# Patient Record
Sex: Female | Born: 1985 | Hispanic: No | Marital: Married | Smoking: Never smoker
Health system: Southern US, Community
[De-identification: ages and names within clinical notes are randomized; demographics above are authoritative.]

## PROBLEM LIST (undated history)

## (undated) ENCOUNTER — Inpatient Hospital Stay (HOSPITAL_COMMUNITY): Payer: Self-pay

## (undated) DIAGNOSIS — O4690 Antepartum hemorrhage, unspecified, unspecified trimester: Secondary | ICD-10-CM

## (undated) DIAGNOSIS — Z789 Other specified health status: Secondary | ICD-10-CM

---

## 2005-01-27 DIAGNOSIS — O321XX0 Maternal care for breech presentation, not applicable or unspecified: Secondary | ICD-10-CM

## 2014-01-15 ENCOUNTER — Other Ambulatory Visit (HOSPITAL_COMMUNITY)
Admission: RE | Admit: 2014-01-15 | Discharge: 2014-01-15 | Disposition: A | Discharge status: Home / Self Care | Payer: Private Health Insurance - Indemnity | Source: Ambulatory Visit | Attending: Family Medicine | Admitting: Family Medicine

## 2014-01-15 DIAGNOSIS — Z113 Encounter for screening for infections with a predominantly sexual mode of transmission: Secondary | ICD-10-CM | POA: Insufficient documentation

## 2014-01-15 DIAGNOSIS — Z124 Encounter for screening for malignant neoplasm of cervix: Secondary | ICD-10-CM | POA: Insufficient documentation

## 2014-06-21 ENCOUNTER — Inpatient Hospital Stay (HOSPITAL_COMMUNITY): Payer: Managed Care, Other (non HMO)

## 2014-06-21 ENCOUNTER — Encounter (HOSPITAL_COMMUNITY): Payer: Self-pay | Admitting: Obstetrics and Gynecology

## 2014-06-21 ENCOUNTER — Inpatient Hospital Stay (HOSPITAL_COMMUNITY)
Admission: AD | Admit: 2014-06-21 | Discharge: 2014-06-21 | Disposition: A | Discharge status: Home / Self Care | Payer: Managed Care, Other (non HMO) | Source: Ambulatory Visit | Attending: Obstetrics and Gynecology | Admitting: Obstetrics and Gynecology

## 2014-06-21 DIAGNOSIS — O4691 Antepartum hemorrhage, unspecified, first trimester: Secondary | ICD-10-CM

## 2014-06-21 DIAGNOSIS — Z3A01 Less than 8 weeks gestation of pregnancy: Secondary | ICD-10-CM | POA: Insufficient documentation

## 2014-06-21 DIAGNOSIS — O200 Threatened abortion: Secondary | ICD-10-CM | POA: Diagnosis not present

## 2014-06-21 DIAGNOSIS — O209 Hemorrhage in early pregnancy, unspecified: Secondary | ICD-10-CM

## 2014-06-21 HISTORY — DX: Other specified health status: Z78.9

## 2014-06-21 LAB — URINALYSIS, ROUTINE W REFLEX MICROSCOPIC
BILIRUBIN URINE: NEGATIVE
Glucose, UA: NEGATIVE mg/dL
KETONES UR: NEGATIVE mg/dL
LEUKOCYTES UA: NEGATIVE
NITRITE: NEGATIVE
Protein, ur: NEGATIVE mg/dL
SPECIFIC GRAVITY, URINE: 1.02 (ref 1.005–1.030)
UROBILINOGEN UA: 0.2 mg/dL (ref 0.0–1.0)
pH: 6 (ref 5.0–8.0)

## 2014-06-21 LAB — URINE MICROSCOPIC-ADD ON

## 2014-06-21 LAB — CBC
HCT: 33.4 % — ABNORMAL LOW (ref 36.0–46.0)
HEMOGLOBIN: 10.8 g/dL — AB (ref 12.0–15.0)
MCH: 25.2 pg — ABNORMAL LOW (ref 26.0–34.0)
MCHC: 32.3 g/dL (ref 30.0–36.0)
MCV: 78 fL (ref 78.0–100.0)
Platelets: 268 10*3/uL (ref 150–400)
RBC: 4.28 MIL/uL (ref 3.87–5.11)
RDW: 16.7 % — ABNORMAL HIGH (ref 11.5–15.5)
WBC: 8.6 10*3/uL (ref 4.0–10.5)

## 2014-06-21 LAB — POCT PREGNANCY, URINE: Preg Test, Ur: POSITIVE — AB

## 2014-06-21 LAB — WET PREP, GENITAL
CLUE CELLS WET PREP: NONE SEEN
TRICH WET PREP: NONE SEEN
Yeast Wet Prep HPF POC: NONE SEEN

## 2014-06-21 LAB — HCG, QUANTITATIVE, PREGNANCY: hCG, Beta Chain, Quant, S: 4819 m[IU]/mL — ABNORMAL HIGH (ref <5)

## 2014-06-21 LAB — ABO/RH: ABO/RH(D): B POS

## 2014-06-21 NOTE — MAU Provider Note (Signed)
History     CSN: [id number]  Arrival date and time: 06/21/14 1406   First Provider Initiated Contact with Patient 06/21/14 1518      Chief Complaint  Patient presents with  . Vaginal Bleeding   HPI Lynn Mckenzie 28 y.o. [id number] @[redacted]w[redacted]d  presents to MAU with vaginal bleeding.  She first had light red spotting 2 days ago.  Yesterday it was brown and heavier spotting.  Today it is more bleeding with both brown and red blood.  She has changed her pad 3 times but they have not been soaked.  She denies abdominal pain or cramping and no dysuria.  She had planned for new ob visit at [facility] on 06/26/14. OB History   Grav Para Term Preterm Abortions TAB SAB Ect Mult Living   4 3 3       3       Past Medical History  Diagnosis Date  . Medical history non-contributory     Past Surgical History  Procedure Laterality Date  . Cesarean section      History reviewed. No pertinent family history.  History  Substance Use Topics  . Smoking status: Never Smoker   . Smokeless tobacco: Never Used  . Alcohol Use: No    Allergies: No Known Allergies  Prescriptions prior to admission  Medication Sig Dispense Refill  . Prenatal Vit-Fe Fumarate-FA (PRENATAL MULTIVITAMIN) TABS tablet Take 1 tablet by mouth daily at 12 noon.        Review of Systems  Constitutional: Negative for fever, chills and diaphoresis.  HENT: Negative for congestion and sore throat.   Eyes: Negative for blurred vision and double vision.  Respiratory: Negative for cough and shortness of breath.   Cardiovascular: Negative for chest pain and palpitations.  Gastrointestinal: Negative for heartburn, nausea, vomiting, abdominal pain, diarrhea and constipation.  Genitourinary: Negative for dysuria and frequency.  Musculoskeletal: Negative for back pain and neck pain.  Skin: Negative for itching and rash.  Neurological: Negative for dizziness, tingling, weakness and headaches.  Psychiatric/Behavioral: Negative for  depression, suicidal ideas and substance abuse. The patient is not nervous/anxious.    Physical Exam   Blood pressure 122/68, pulse 75, temperature 98 F (36.7 C), temperature source Oral, resp. rate 16, last menstrual period 05/01/2014.  Physical Exam  Constitutional: She is oriented to person, place, and time. She appears well-developed and well-nourished.  HENT:  Head: Normocephalic and atraumatic.  Eyes: EOM are normal.  Neck: Normal range of motion.  Cardiovascular: Normal rate and regular rhythm.   Respiratory: Effort normal and breath sounds normal. No respiratory distress.  GI: Soft. Bowel sounds are normal. She exhibits no distension and no mass. There is no tenderness. There is no rebound and no guarding.  Genitourinary:  Moderate amt of dark red blood pooled in vagina.   Cervix is closed.  No CMT/adnexal mass or tenderness  Musculoskeletal: Normal range of motion.  Neurological: She is alert and oriented to person, place, and time.  Skin: Skin is warm and dry.  Psychiatric: She has a normal mood and affect.   Results for orders placed during the hospital encounter of 06/21/14 (from the past 48 hour(s))  URINALYSIS, ROUTINE W REFLEX MICROSCOPIC     Status: Abnormal   Collection Time    06/21/14  2:25 PM      Result Value Ref Range   Color, Urine YELLOW  YELLOW   APPearance CLEAR  CLEAR   Specific Gravity, Urine 1.020  1.005 - 1.030   pH  6.0  5.0 - 8.0   Glucose, UA NEGATIVE  NEGATIVE mg/dL   Hgb urine dipstick TRACE (*) NEGATIVE   Bilirubin Urine NEGATIVE  NEGATIVE   Ketones, ur NEGATIVE  NEGATIVE mg/dL   Protein, ur NEGATIVE  NEGATIVE mg/dL   Urobilinogen, UA 0.2  0.0 - 1.0 mg/dL   Nitrite NEGATIVE  NEGATIVE   Leukocytes, UA NEGATIVE  NEGATIVE  URINE MICROSCOPIC-ADD ON     Status: Abnormal   Collection Time    06/21/14  2:25 PM      Result Value Ref Range   Squamous Epithelial / LPF FEW (*) RARE   WBC, UA 0-2  <3 WBC/hpf   RBC / HPF 0-2  <3 RBC/hpf  POCT  PREGNANCY, URINE     Status: Abnormal   Collection Time    06/21/14  2:34 PM      Result Value Ref Range   Preg Test, Ur POSITIVE (*) NEGATIVE   Comment:            THE SENSITIVITY OF THIS     METHODOLOGY IS >24 mIU/mL  CBC     Status: Abnormal   Collection Time    06/21/14  3:37 PM      Result Value Ref Range   WBC 8.6  4.0 - 10.5 K/uL   RBC 4.28  3.87 - 5.11 MIL/uL   Hemoglobin 10.8 (*) 12.0 - 15.0 g/dL   HCT 16.1 (*) 09.6 - 04.5 %   MCV 78.0  78.0 - 100.0 fL   MCH 25.2 (*) 26.0 - 34.0 pg   MCHC 32.3  30.0 - 36.0 g/dL   RDW 40.9 (*) 81.1 - 91.4 %   Platelets 268  150 - 400 K/uL  ABO/RH     Status: None   Collection Time    06/21/14  3:37 PM      Result Value Ref Range   ABO/RH(D) B POS    HCG, QUANTITATIVE, PREGNANCY     Status: Abnormal   Collection Time    06/21/14  3:37 PM      Result Value Ref Range   hCG, Beta Chain, Quant, S 4819 (*) <5 mIU/mL   Comment:              GEST. AGE      CONC.  (mIU/mL)       <=1 WEEK        5 - 50         2 WEEKS       50 - 500         3 WEEKS       100 - 10,000         4 WEEKS     1,000 - 30,000         5 WEEKS     3,500 - 115,000       6-8 WEEKS     12,000 - 270,000        12 WEEKS     15,000 - 220,000                FEMALE AND NON-PREGNANT FEMALE:         LESS THAN 5 mIU/mL  WET PREP, GENITAL     Status: Abnormal   Collection Time    06/21/14  4:00 PM      Result Value Ref Range   Yeast Wet Prep HPF POC NONE SEEN  NONE SEEN   Trich, Wet Prep NONE SEEN  NONE SEEN   Clue Cells Wet Prep HPF POC NONE SEEN  NONE SEEN   WBC, Wet Prep HPF POC FEW (*) NONE SEEN   Comment: FEW BACTERIA SEEN   [place] Ob Comp Less 14 Wks  06/21/2014   CLINICAL DATA:  Quantitative beta HCG 4,819, vaginal bleeding, first trimester pregnancy  EXAM: OBSTETRIC <14 WK ULTRASOUND  TECHNIQUE: Both transabdominal and transvaginal ultrasound examinations were performed for complete evaluation of the gestation as well as the maternal uterus, adnexal regions, and  pelvic cul-de-sac. Transvaginal technique was performed to assess early pregnancy.  Color and duplex Doppler ultrasound was utilized to evaluate blood flow to the ovaries.  COMPARISON:  None.  FINDINGS: Intrauterine gestational sac: None visualized  Yolk sac:  None visualized  Embryo:  None visualized  Cardiac Activity: Not visualized  Maternal uterus/adnexae: Right ovary is normal, containing a 2 cm cyst. Left ovary is not identified. The endometrium is thickened to 21 mm.There is trace free fluid.  IMPRESSION: No evidence of intrauterine gestation. Findings could represent early intrauterine gestation too early to visualize versus failed gestation. There are no specific features to suggest ectopic pregnancy, but the left ovary is not identified. Probable early intrauterine gestational sac, but no yolk sac, fetal pole, or cardiac activity yet visualized. Recommend follow-up quantitative B-HCG levels and follow-up US in 14 days to confirm and assess viability. This recommendation follows SRU consensus guidelines: Diagnostic Criteria for Nonviable Pregnancy Early in the First Trimester.   Electronically Signed   By: Esperanza Heiraymond  Rubner M.D.   On: 06/21/2014 17:14   [place] Ob Transvaginal  06/21/2014   CLINICAL DATA:  Quantitative beta HCG 4,819, vaginal bleeding, first trimester pregnancy  EXAM: OBSTETRIC <14 WK ULTRASOUND  TECHNIQUE: Both transabdominal and transvaginal ultrasound examinations were performed for complete evaluation of the gestation as well as the maternal uterus, adnexal regions, and pelvic cul-de-sac. Transvaginal technique was performed to assess early pregnancy.  Color and duplex Doppler ultrasound was utilized to evaluate blood flow to the ovaries.  COMPARISON:  None.  FINDINGS: Intrauterine gestational sac: None visualized  Yolk sac:  None visualized  Embryo:  None visualized  Cardiac Activity: Not visualized  Maternal uterus/adnexae: Right ovary is normal, containing a 2 cm cyst. Left ovary is not  identified. The endometrium is thickened to 21 mm.There is trace free fluid.  IMPRESSION: No evidence of intrauterine gestation. Findings could represent early intrauterine gestation too early to visualize versus failed gestation. There are no specific features to suggest ectopic pregnancy, but the left ovary is not identified. Probable early intrauterine gestational sac, but no yolk sac, fetal pole, or cardiac activity yet visualized. Recommend follow-up quantitative B-HCG levels and follow-up US in 14 days to confirm and assess viability. This recommendation follows SRU consensus guidelines: Diagnostic Criteria for Nonviable Pregnancy Early in the First Trimester.   Electronically Signed   By: Esperanza Heiraymond  Rubner M.D.   On: 06/21/2014 17:14     MAU Course  Procedures  MDM Quant of 4819, presence of moderate bleeding, positive blood type, u/s consistent with failed gestation vs early gestation.    Assessment and Plan  A: threatened abortion, vaginal bleeding in first trimester  P: Discharge to home Tylenol for pain management. Ectopic precautions Return to MAU for heavy bleeding, severe pain, etc. [facility]  If unable to be seen there, follow up in MAU in 48 hours for Quant HCG.   Return to MAU for emergency.  Glyn Adeeague Clark, Clydie BraunKaren  E 06/21/2014, 3:23 PM

## 2014-06-21 NOTE — MAU Note (Signed)
Pt presents stating that she is [redacted] weeks pregnant and started having bright red bleeding yesterday and it has gotten heavier today

## 2014-06-21 NOTE — Discharge Instructions (Signed)

## 2014-06-21 NOTE — MAU Provider Note (Signed)
Attestation of Attending Supervision of Advanced Practitioner (CNM/NP): Evaluation and management procedures were performed by the Advanced Practitioner under my supervision and collaboration.  I have reviewed the Advanced Practitioner's note and chart, and I agree with the management and plan.  Jeanmarc Viernes 06/21/2014 6:56 PM

## 2014-07-20 ENCOUNTER — Encounter (HOSPITAL_COMMUNITY): Payer: Self-pay | Admitting: Obstetrics and Gynecology

## 2014-09-05 ENCOUNTER — Encounter (HOSPITAL_COMMUNITY): Payer: Self-pay

## 2014-09-05 ENCOUNTER — Inpatient Hospital Stay (HOSPITAL_COMMUNITY)
Admission: AD | Admit: 2014-09-05 | Discharge: 2014-09-05 | Disposition: A | Discharge status: Home / Self Care | Payer: Managed Care, Other (non HMO) | Source: Ambulatory Visit | Attending: Obstetrics & Gynecology | Admitting: Obstetrics & Gynecology

## 2014-09-05 DIAGNOSIS — O209 Hemorrhage in early pregnancy, unspecified: Secondary | ICD-10-CM | POA: Diagnosis present

## 2014-09-05 DIAGNOSIS — O200 Threatened abortion: Secondary | ICD-10-CM | POA: Insufficient documentation

## 2014-09-05 DIAGNOSIS — O4690 Antepartum hemorrhage, unspecified, unspecified trimester: Secondary | ICD-10-CM

## 2014-09-05 DIAGNOSIS — Z3A01 Less than 8 weeks gestation of pregnancy: Secondary | ICD-10-CM | POA: Insufficient documentation

## 2014-09-05 HISTORY — DX: Antepartum hemorrhage, unspecified, unspecified trimester: O46.90

## 2014-09-05 LAB — URINALYSIS, ROUTINE W REFLEX MICROSCOPIC
Bilirubin Urine: NEGATIVE
Glucose, UA: NEGATIVE mg/dL
Ketones, ur: NEGATIVE mg/dL
LEUKOCYTES UA: NEGATIVE
NITRITE: NEGATIVE
PROTEIN: NEGATIVE mg/dL
SPECIFIC GRAVITY, URINE: 1.02 (ref 1.005–1.030)
Urobilinogen, UA: 0.2 mg/dL (ref 0.0–1.0)
pH: 7 (ref 5.0–8.0)

## 2014-09-05 LAB — URINE MICROSCOPIC-ADD ON

## 2014-09-05 LAB — CBC
HEMATOCRIT: 34.4 % — AB (ref 36.0–46.0)
Hemoglobin: 11.3 g/dL — ABNORMAL LOW (ref 12.0–15.0)
MCH: 27.7 pg (ref 26.0–34.0)
MCHC: 32.8 g/dL (ref 30.0–36.0)
MCV: 84.3 fL (ref 78.0–100.0)
PLATELETS: 271 10*3/uL (ref 150–400)
RBC: 4.08 MIL/uL (ref 3.87–5.11)
RDW: 16.3 % — AB (ref 11.5–15.5)
WBC: 8.7 10*3/uL (ref 4.0–10.5)

## 2014-09-05 LAB — HCG, QUANTITATIVE, PREGNANCY: HCG, BETA CHAIN, QUANT, S: 11 m[IU]/mL — AB (ref <5)

## 2014-09-05 LAB — ABO/RH: ABO/RH(D): B POS

## 2014-09-05 MED ORDER — PRENATAL VITAMINS PLUS 27-1 MG PO TABS: 1.0000 | ORAL_TABLET | Freq: Every day | ORAL | Status: DC

## 2014-09-05 NOTE — MAU Provider Note (Signed)
History     CSN: [id number]  Arrival date and time: 09/05/14 1806  Provider notified: 1843 & 1952 Provider on unit: 2040 Provider at bedside: 2045   Chief Complaint  Patient presents with  . Vaginal Bleeding   HPI  Ms. Lynn Mckenzie is a 28 yo G5P3013 at 5.[redacted] wks gestation presenting with heavy vaginal bleeding that started tonight.  The only activity she did today was cook for out of town friends.  She is having cramping in her lower abdomen. She was seen in the office 08/31/2014 for a quant (41.0).  Her primary OB provider at [facility] is Dr. Ernestina PennaFogleman.  Past Medical History  Diagnosis Date  . Medical history non-contributory   . Vaginal bleeding during pregnancy, antepartum 09/05/2014    Past Surgical History  Procedure Laterality Date  . Cesarean section      No family history on file.  History  Substance Use Topics  . Smoking status: Never Smoker   . Smokeless tobacco: Never Used  . Alcohol Use: No    Allergies: No Known Allergies  Prescriptions prior to admission  Medication Sig Dispense Refill Last Dose  . Prenatal Vit-Fe Fumarate-FA (PRENATAL MULTIVITAMIN) TABS tablet Take 1 tablet by mouth daily.    09/04/2014 at Unknown time    Review of Systems  Constitutional: Negative.   HENT: Negative.   Eyes: Negative.   Respiratory: Negative.   Cardiovascular: Negative.   Gastrointestinal: Negative.   Genitourinary:       Heavy, red vaginal bleeding; lower abdominal pain  Musculoskeletal: Negative.   Skin: Negative.   Neurological: Negative.   Endo/Heme/Allergies: Negative.   Psychiatric/Behavioral: Negative.    Results for orders placed or performed during the hospital encounter of 09/05/14 (from the past 24 hour(s))  Urinalysis, Routine w reflex microscopic     Status: Abnormal   Collection Time: 09/05/14  6:20 PM  Result Value Ref Range   Color, Urine YELLOW YELLOW   APPearance CLEAR CLEAR   Specific Gravity, Urine 1.020 1.005 - 1.030   pH 7.0 5.0 -  8.0   Glucose, UA NEGATIVE NEGATIVE mg/dL   Hgb urine dipstick SMALL (A) NEGATIVE   Bilirubin Urine NEGATIVE NEGATIVE   Ketones, ur NEGATIVE NEGATIVE mg/dL   Protein, ur NEGATIVE NEGATIVE mg/dL   Urobilinogen, UA 0.2 0.0 - 1.0 mg/dL   Nitrite NEGATIVE NEGATIVE   Leukocytes, UA NEGATIVE NEGATIVE  Urine microscopic-add on     Status: Abnormal   Collection Time: 09/05/14  6:20 PM  Result Value Ref Range   Squamous Epithelial / LPF FEW (A) RARE   WBC, UA 0-2 <3 WBC/hpf   RBC / HPF 7-10 <3 RBC/hpf  hCG, quantitative, pregnancy     Status: Abnormal   Collection Time: 09/05/14  7:10 PM  Result Value Ref Range   hCG, Beta Chain, Quant, S 11 (H) <5 mIU/mL  CBC     Status: Abnormal   Collection Time: 09/05/14  7:10 PM  Result Value Ref Range   WBC 8.7 4.0 - 10.5 K/uL   RBC 4.08 3.87 - 5.11 MIL/uL   Hemoglobin 11.3 (L) 12.0 - 15.0 g/dL   HCT 72.534.4 (L) 36.636.0 - 44.046.0 %   MCV 84.3 78.0 - 100.0 fL   MCH 27.7 26.0 - 34.0 pg   MCHC 32.8 30.0 - 36.0 g/dL   RDW 34.716.3 (H) 42.511.5 - 95.615.5 %   Platelets 271 150 - 400 K/uL  ABO/Rh     Status: None   Collection Time: 09/05/14  7:10 PM  Result Value Ref Range   ABO/RH(D) B POS    Physical Exam   Blood pressure 106/64, pulse 73, temperature 98.5 F (36.9 C), resp. rate 18, height 5' 4.5" (1.638 m), weight 86.41 kg (190 lb 8 oz), last menstrual period 07/27/2014  Physical Exam  Constitutional: She is oriented to person, place, and time. She appears well-developed and well-nourished.  HENT:  Head: Normocephalic and atraumatic.  Eyes: Pupils are equal, round, and reactive to light.  Neck: Normal range of motion.  Cardiovascular: Normal rate, regular rhythm and normal heart sounds.   Respiratory: Effort normal and breath sounds normal.  GI: Soft. Bowel sounds are normal.  Genitourinary:  Uterus slightly enlarged; small amount of mucoid dark, red blood removed with large swabs; small trickle of red blood coming from cervical os; VE:  closed/long/average/posterior  Musculoskeletal: Normal range of motion.  Neurological: She is alert and oriented to person, place, and time. She has normal reflexes.  Skin: Skin is warm and dry.  Psychiatric: She has a normal mood and affect. Her behavior is normal. Judgment and thought content normal.    MAU Course  Procedures [facility]UA UPT CBC ABO/Rh Beta HCG Assessment and Plan  28 yo [id number] @ 5.[redacted] wks gestation Threatened Miscarriage - falling quant Vaginal Bleeding in pregnancy, first trimester  Discharge Home Bleeding Precautions Keep scheduled appointment on Monday 09/07/2014 Call the office for increased bleeding and/or pain   Raelyn MoraAWSON, Barclay Lennox, Judie PetitM MSN, CNM 09/05/2014, 9:02 PM

## 2014-09-05 NOTE — MAU Note (Signed)
Pt states had positive upt at home and went to MD office and also had +upt. Has appt Monday for BHCG. Began bleeding today and is concerned because last pregnancy ended in miscarriage.

## 2014-09-05 NOTE — Discharge Instructions (Signed)
Human Chorionic Gonadotropin (hCG) This is a test to confirm and monitor pregnancy or to diagnose trophoblastic disease or germ cell tumors. As early as 10 days after a missed menstrual period (some methods can detect hCG even earlier, at one week after conception) or if your caregiver thinks that your symptoms suggest ectopic pregnancy, a failing pregnancy, trophoblastic disease, or germ cell tumors. hCG is a protein produced in the placenta of a pregnant woman. A pregnancy test is a specific blood or urine test that can detect hCG and confirm pregnancy. This hormone is able to be detected 10 days after a missed menstrual period, the time period when the fertilized egg is implanted in the woman's uterus. With some methods, hCG can be detected even earlier, at one week after conception.  During the early weeks of pregnancy, hCG is important in maintaining function of the corpus luteum (the mass of cells that forms from a mature egg). Production of hCG increases steadily during the first trimester (8-10 weeks), peaking around the 10th week after the last menstrual cycle. Levels then fall slowly during the remainder of the pregnancy. hCG is no longer detectable within a few weeks after delivery. hCG is also produced by some germ cell tumors and increased levels are seen in trophoblastic disease. SAMPLE COLLECTION Vaginal Bleeding During Pregnancy, First Trimester A small amount of bleeding (spotting) from the vagina is common in early pregnancy. Sometimes the bleeding is normal and is not a problem, and sometimes it is a sign of something serious. Be sure to tell your doctor about any bleeding from your vagina right away. HOME CARE  Watch your condition for any changes.  Follow your doctor's instructions about how active you can be.  If you are on bed rest:  You may need to stay in bed and only get up to use the bathroom.  You may be allowed to do some activities.  If you need help, make plans for  someone to help you.  Write down:  The number of pads you use each day.  How often you change pads.  How soaked (saturated) your pads are.  Do not use tampons.  Do not douche.  Do not have sex or orgasms until your doctor says it is okay.  If you pass any tissue from your vagina, save the tissue so you can show it to your doctor.  Only take medicines as told by your doctor.  Do not take aspirin because it can make you bleed.  Keep all follow-up visits as told by your doctor. GET HELP IF:   You bleed from your vagina.  You have cramps.  You have labor pains.  You have a fever that does not go away after you take medicine. GET HELP RIGHT AWAY IF:   You have very bad cramps in your back or belly (abdomen).  You pass large clots or tissue from your vagina.  You bleed more.  You feel light-headed or weak.  You pass out (faint).  You have chills.  You are leaking fluid or have a gush of fluid from your vagina.  You pass out while pooping (having a bowel movement). MAKE SURE YOU:  Understand these instructions.  Will watch your condition.  Will get help right away if you are not doing well or get worse. Document Released: 01/19/2014 Document Reviewed: 05/12/2013 [facility] Patient Information 2015 [place], [place]. This information is not intended to replace advice given to you by your health care provider. Make sure you  discuss any questions you have with your health care provider.  hCG is commonly detected in urine. The preferred specimen is a random urine sample collected first thing in the morning. hCG can also be measured in blood drawn from a vein in the arm. NORMAL FINDINGS Qualitative:negative in non-pregnant women; positive in pregnancy Quantitative:   Gestation less than 1 week: 5-50 Whole HCG (milli-international units/mL)  Gestation of 2 weeks: 50-500 Whole HCG (milli-international units/mL)  Gestation of 3 weeks: 100-10,000 Whole HCG  (milli-international units/mL)  Gestation of 4 weeks: 1,000-30,000 Whole HCG (milli-international units/mL)  Gestation of 5 weeks 3,500-115,000 Whole HCG (milli-international units/mL)  Gestation of 6-8 weeks: 12,000-270,000 Whole HCG (milli-international units/mL)  Gestation of 12 weeks: 15,000-220,000 Whole HCG (milli-international units/mL)  Males and non-pregnant females: less than 5 Whole HCG (milli-international units/mL) Beta subunit: depends on the method and test used Ranges for normal findings may vary among different laboratories and hospitals. You should always check with your doctor after having lab work or other tests done to discuss the meaning of your test results and whether your values are considered within normal limits. MEANING OF TEST  Your caregiver will go over the test results with you and discuss the importance and meaning of your results, as well as treatment options and the need for additional tests if necessary. OBTAINING THE TEST RESULTS It is your responsibility to obtain your test results. Ask the lab or department performing the test when and how you will get your results. Document Released: 10/06/2004 Document Revised: 11/27/2011 Document Reviewed: 12/08/2013 [facility] Patient Information 2015 [place], [place]. This information is not intended to replace advice given to you by your health care provider. Make sure you discuss any questions you have with your health care provider.   Threatened Miscarriage A threatened miscarriage is when you have vaginal bleeding during your first 20 weeks of pregnancy but the pregnancy has not ended. Your doctor will do tests to make sure you are still pregnant. The cause of the bleeding may not be known. This condition does not mean your pregnancy will end. It does increase the risk of it ending (complete miscarriage). HOME CARE   Make sure you keep all your doctor visits for prenatal care.  Get plenty of rest.  Do not have  sex or use tampons if you have vaginal bleeding.  Do not douche.  Do not smoke or use drugs.  Do not drink alcohol.  Avoid caffeine. GET HELP IF:  You have light bleeding from your vagina.  You have belly pain or cramping.  You have a fever. GET HELP RIGHT AWAY IF:   You have heavy bleeding from your vagina.  You have clots of blood coming from your vagina.  You have bad pain or cramps in your low back or belly.  You have fever, chills, and bad belly pain. MAKE SURE YOU:   Understand these instructions.  Will watch your condition.  Will get help right away if you are not doing well or get worse. Document Released: 08/17/2008 Document Revised: 09/09/2013 Document Reviewed: 07/01/2013 [facility] Patient Information 2015 [place], [place]. This information is not intended to replace advice given to you by your health care provider. Make sure you discuss any questions you have with your health care provider.

## 2014-09-07 LAB — POCT PREGNANCY, URINE: Preg Test, Ur: NEGATIVE

## 2015-05-25 LAB — OB RESULTS CONSOLE RUBELLA ANTIBODY, IGM: Rubella: IMMUNE

## 2015-05-25 LAB — OB RESULTS CONSOLE HIV ANTIBODY (ROUTINE TESTING): HIV: NONREACTIVE

## 2015-05-25 LAB — OB RESULTS CONSOLE HEPATITIS B SURFACE ANTIGEN: Hepatitis B Surface Ag: NEGATIVE

## 2015-05-25 LAB — OB RESULTS CONSOLE ABO/RH: RH Type: POSITIVE

## 2015-05-25 LAB — OB RESULTS CONSOLE HGB/HCT, BLOOD
HEMATOCRIT: 34 %
HEMOGLOBIN: 11.8 g/dL

## 2015-05-25 LAB — OB RESULTS CONSOLE RPR: RPR: NONREACTIVE

## 2015-05-25 LAB — OB RESULTS CONSOLE ANTIBODY SCREEN: Antibody Screen: NEGATIVE

## 2015-05-25 LAB — OB RESULTS CONSOLE VARICELLA ZOSTER ANTIBODY, IGG: Varicella: IMMUNE

## 2015-05-25 LAB — OB RESULTS CONSOLE PLATELET COUNT: PLATELETS: 267 10*3/uL

## 2015-06-08 LAB — OB RESULTS CONSOLE GC/CHLAMYDIA
CHLAMYDIA, DNA PROBE: NEGATIVE
GC PROBE AMP, GENITAL: NEGATIVE

## 2015-07-11 ENCOUNTER — Encounter (HOSPITAL_COMMUNITY): Payer: Self-pay | Admitting: *Deleted

## 2015-09-16 LAB — OB RESULTS CONSOLE PLATELET COUNT: PLATELETS: 206 10*3/uL

## 2015-09-16 LAB — OB RESULTS CONSOLE HGB/HCT, BLOOD
HCT: 28 %
Hemoglobin: 9.9 g/dL

## 2015-09-19 NOTE — L&D Delivery Note (Signed)
Patient is 30 y.o. [id number] 9145w4d admitted for onset of labor, hx of c-section with second child.  Successful VBAC with previous baby   Delivery Note At 1:17 AM a viable female was delivered via VBAC, Spontaneous (Presentation: Left Occiput Anterior).  APGAR: 9, 9; weight pending.  Infant dried and placed on mother's abdomen.  Cord clamped and cut by mother of baby.  Hospital cord blood sample collected.  Placenta delivered, fundal massage applied, and vagina inspected for lacerations.  Superficial laceration appreciated at the 5 o'clock position of the vagina.  This did not require repair and was hemostatic after delivery.  Placenta status: Intact, Spontaneous.  Cord: 3 vessels with the following complications: nuchal x1.   Anesthesia: Epidural  Episiotomy: None Lacerations: None Suture Repair: n/a Est. Blood Loss (mL): 100  Mom to postpartum.  Baby to Couplet care / Skin to Skin.  Philipp DeputyKim Jt Brabec, CNM, was gloved and supervised the entirety of this delivery.  Delynn FlavinAshly Gottschalk, DO 12/25/2015, 1:36 AM  I was gloved and present for delivery in its entirety.  Second stage of labor progressed to SVD/VBAC; few decels during second stage noted.  Complications: none  Lacerations: none  EBL: 100cc  Senna Lape, CNM 10:45 PM

## 2015-10-01 LAB — OB RESULTS CONSOLE RPR: RPR: NONREACTIVE

## 2015-11-16 ENCOUNTER — Encounter: Payer: Self-pay | Admitting: *Deleted

## 2015-11-16 DIAGNOSIS — Z3490 Encounter for supervision of normal pregnancy, unspecified, unspecified trimester: Secondary | ICD-10-CM

## 2015-11-16 DIAGNOSIS — Z98891 History of uterine scar from previous surgery: Secondary | ICD-10-CM | POA: Insufficient documentation

## 2015-11-23 ENCOUNTER — Ambulatory Visit (INDEPENDENT_AMBULATORY_CARE_PROVIDER_SITE_OTHER): Payer: Self-pay | Admitting: Family

## 2015-11-23 VITALS — BP 99/64 | HR 89 | Temp 98.4°F | Wt 211.1 lb

## 2015-11-23 DIAGNOSIS — Z113 Encounter for screening for infections with a predominantly sexual mode of transmission: Secondary | ICD-10-CM

## 2015-11-23 DIAGNOSIS — O34219 Maternal care for unspecified type scar from previous cesarean delivery: Secondary | ICD-10-CM

## 2015-11-23 DIAGNOSIS — Z3493 Encounter for supervision of normal pregnancy, unspecified, third trimester: Secondary | ICD-10-CM

## 2015-11-23 LAB — OB RESULTS CONSOLE GBS: STREP GROUP B AG: NEGATIVE

## 2015-11-23 LAB — POCT URINALYSIS DIP (DEVICE)
Bilirubin Urine: NEGATIVE
Glucose, UA: NEGATIVE mg/dL
HGB URINE DIPSTICK: NEGATIVE
KETONES UR: NEGATIVE mg/dL
Nitrite: NEGATIVE
PROTEIN: NEGATIVE mg/dL
SPECIFIC GRAVITY, URINE: 1.02 (ref 1.005–1.030)
UROBILINOGEN UA: 0.2 mg/dL (ref 0.0–1.0)
pH: 7.5 (ref 5.0–8.0)

## 2015-11-23 NOTE — Progress Notes (Signed)
   Subjective:    Tami LinSamirah Sessler is a [id number] 1842w0d being seen today for her first obstetrical visit.  [facility] is a [facility].  Her obstetrical history is significant for prior csection and marginal cord insertion. History of one successful VBAC and one vaginal delivery prior to csection.  Primary csection for Breech.  Patient does intend to breast feed. Pregnancy history fully reviewed.  Patient reports no complaints.  Filed Vitals:   11/23/15 1054  BP: 99/64  Pulse: 89  Temp: 98.4 F (36.9 C)  Weight: 211 lb 1.6 oz (95.754 kg)    HISTORY: OB History  Gravida Para Term Preterm AB SAB TAB Ectopic Multiple Living  5 3 3  1 1    3     # Outcome Date GA Lbr Len/2nd Weight Sex Delivery Anes PTL Lv  5 Current           4 Term 06/03/09 4655w0d  6 lb (2.722 kg) M Vag-Spont None  Y  3 Term 01/27/05 4155w0d  6 lb (2.722 kg) F CS-LTranv Gen  Y     Complications: Breech birth  2 Term 12/20/03 6455w0d  6 lb (2.722 kg) M Vag-Spont EPI  Y  1 SAB              Past Medical History  Diagnosis Date  . Medical history non-contributory   . Vaginal bleeding during pregnancy, antepartum 09/05/2014   Past Surgical History  Procedure Laterality Date  . Cesarean section     Family History  Problem Relation Age of Onset  . Hypertension Mother   . Diabetes Father   . Diabetes Brother      Exam   Filed Vitals:   11/23/15 1054  BP: 99/64  Pulse: 89  Temp: 98.4 F (36.9 C)  Weight: 211 lb 1.6 oz (95.754 kg)    Fetal Status: Fetal Heart Rate (bpm): 158 Fundal Height: 36 cm Movement: Present  Presentation: Undeterminable  General:  Alert, oriented and cooperative. Patient is in no acute distress.  Skin: Skin is warm and dry. No rash noted.   Cardiovascular: Normal heart rate noted  Respiratory: Normal respiratory effort, no problems with respiration noted  Abdomen: Soft, gravid, appropriate for gestational age. Pain/Pressure: Present     Pelvic: Vag. Bleeding: None     Cervical  exam deferred Dilation: Fingertip Effacement (%): Thick    Extremities: Normal range of motion.  Edema: None  Mental Status: Normal mood and affect. Normal behavior. Normal judgment and thought content.   Urinalysis: Urine Protein: Negative Urine Glucose: Negative     Assessment:    Pregnancy: [id number] Patient Active Problem List   Diagnosis Date Noted  . Previous cesarean delivery affecting pregnancy, antepartum 11/23/2015  . Supervision of low-risk pregnancy 11/16/2015  . History of VBAC 11/16/2015        Plan:     Reviewed prenatal records. GBS and GC/CT ordered Prenatal vitamins. Problem list reviewed and updated.  Ultrasound discussed; fetal survey: ordered.  Follow up in 1 weeks.  Marlis EdelsonKARIM, Jarelle Ates N 11/23/2015

## 2015-11-23 NOTE — Progress Notes (Signed)
36 wk cultures today   

## 2015-11-24 ENCOUNTER — Ambulatory Visit (HOSPITAL_COMMUNITY): Payer: Self-pay

## 2015-11-24 LAB — GC/CHLAMYDIA PROBE AMP (~~LOC~~) NOT AT ARMC
CHLAMYDIA, DNA PROBE: NEGATIVE
NEISSERIA GONORRHEA: NEGATIVE

## 2015-11-25 LAB — CULTURE, BETA STREP (GROUP B ONLY)

## 2015-12-01 ENCOUNTER — Encounter (HOSPITAL_COMMUNITY): Payer: Self-pay | Admitting: *Deleted

## 2015-12-01 ENCOUNTER — Encounter: Payer: Self-pay | Admitting: Family Medicine

## 2015-12-01 ENCOUNTER — Inpatient Hospital Stay (HOSPITAL_COMMUNITY)
Admission: AD | Admit: 2015-12-01 | Discharge: 2015-12-01 | Disposition: A | Discharge status: Home / Self Care | Payer: Medicaid Other | Source: Ambulatory Visit | Attending: Obstetrics and Gynecology | Admitting: Obstetrics and Gynecology

## 2015-12-01 DIAGNOSIS — N898 Other specified noninflammatory disorders of vagina: Secondary | ICD-10-CM | POA: Insufficient documentation

## 2015-12-01 DIAGNOSIS — O26893 Other specified pregnancy related conditions, third trimester: Secondary | ICD-10-CM | POA: Insufficient documentation

## 2015-12-01 DIAGNOSIS — R1031 Right lower quadrant pain: Secondary | ICD-10-CM | POA: Diagnosis not present

## 2015-12-01 DIAGNOSIS — Z3A37 37 weeks gestation of pregnancy: Secondary | ICD-10-CM | POA: Diagnosis not present

## 2015-12-01 DIAGNOSIS — Z3493 Encounter for supervision of normal pregnancy, unspecified, third trimester: Secondary | ICD-10-CM

## 2015-12-01 DIAGNOSIS — O34219 Maternal care for unspecified type scar from previous cesarean delivery: Secondary | ICD-10-CM

## 2015-12-01 DIAGNOSIS — O26899 Other specified pregnancy related conditions, unspecified trimester: Secondary | ICD-10-CM

## 2015-12-01 DIAGNOSIS — Z98891 History of uterine scar from previous surgery: Secondary | ICD-10-CM

## 2015-12-01 LAB — WET PREP, GENITAL
CLUE CELLS WET PREP: NONE SEEN
Sperm: NONE SEEN
TRICH WET PREP: NONE SEEN
Yeast Wet Prep HPF POC: NONE SEEN

## 2015-12-01 LAB — POCT FERN TEST: POCT Fern Test: NEGATIVE

## 2015-12-01 LAB — OB RESULTS CONSOLE GC/CHLAMYDIA: Gonorrhea: NEGATIVE

## 2015-12-01 NOTE — MAU Provider Note (Signed)
MAU HISTORY AND PHYSICAL  Chief Complaint:  Abdominal Pain and Vaginal Discharge   Lynn Mckenzie is a 30 y.o.  [id number] with IUP at 9427w1d presenting for Abdominal Pain and Vaginal Discharge Lynn Mckenzie is a 30 yo 675P3013 female at 6427w1d presenting to the MAU today complaining of right lower quadrant pain and discharge. The patient states her pain started 3 days ago, and has been intermittent since that time. The pain presents when the patient lies supine or on her R side when trying to sleep. The pain usually lasts 3-4 minutes and is resolved by moving. The patient states at its worst, the pain is 4/10, and 0/10 when not supine. The patient has not tried any medications or remedies to reliever her pain, as moving/sitting up resolves the pain. The patient denies trauma to the area, and can not recall any incident to cause injury to the area. She is worried about issues with the fetus, which is why she came to the MAU today.  The patient also is reporting scant, minimal clear, non-odorous, non-bloody fluid leaking intermittently over the past 3 weeks. The patient is unable to report an amount, but states that she does not soak through her underwear. She does not think the fluid is urine, as she is able to void independently throughout the day. The patient denies chest pain, SOB, palpitations, nausea, vomiting, diarrhea, constipation, recent illness, fever, loss of appetite, food intolerance, and sick contacts at home. She is very worried that her RLQ pain is a sign that something is wrong with the fetus.  Past Medical History  Diagnosis Date  . Medical history non-contributory   . Vaginal bleeding during pregnancy, antepartum 09/05/2014    Past Surgical History  Procedure Laterality Date  . Cesarean section      Family History  Problem Relation Age of Onset  . Hypertension Mother   . Diabetes Father   . Diabetes Brother     Social History  Substance Use Topics  . Smoking status:  Never Smoker   . Smokeless tobacco: Never Used  . Alcohol Use: No    No Known Allergies  Prescriptions prior to admission  Medication Sig Dispense Refill Last Dose  . ferrous sulfate 325 (65 FE) MG tablet Take 325 mg by mouth daily with breakfast.   12/01/2015 at Unknown time  . Prenatal Vit-Fe Fumarate-FA (PRENATAL VITAMINS PLUS) 27-1 MG TABS Take 1 tablet by mouth daily. 30 tablet 6 12/01/2015 at Unknown time    Review of Systems - Negative except for what is mentioned in HPI.  Physical Exam  Last menstrual period 03/16/2015, unknown if currently breastfeeding. GENERAL: Well-developed, well-nourished female in no acute distress.  LUNGS: Clear to auscultation bilaterally.  HEART: Regular rate and rhythm. ABDOMEN: Soft, nontender, nondistended, gravid.  EXTREMITIES: Nontender, no edema, 2+ distal pulses. Cervical Exam: 0/thick/posterior Presentation: cephalic FHT:  Cat 1 tracing Contractions:    Labs: No results found for this or any previous visit (from the past 24 hour(s)).  Imaging Studies:  No results found.  Assessment: Lynn Mckenzie is  30 y.o. [id number] at 827w1d presents with Abdominal Pain and Vaginal Discharge . #Clear fluid  DDX includes UTI ROM, urinary incontinence, vaginal candidiasis, and BV. -For ROM, will perform speculum exam and fern test.  Will consider amnisure if high suspicion for ROM. -To R/o BV and candidal infection will get wet prep. -Urinary incontinence is a diagnosis of exclusion.    #RLQ pain- Although this pain is likely msk and related  to pregnancy, will want to r/o UTI and labor. No n/v, no fever, no anorexia to suggest appendicitis. Nor peritoneal signs on physical exam -The patient currently does not have contraction.  R/o labor.   -UA resulted negative.  Will not send   #Disposition:  The fluid discharge is likely due to physiologic discharge of pregnancy as wet prep, fern-pooling exam, and UA were negative. The patient will  follow-up with for routine prenatal care.      Nolon Bussing [place] 3/15/20173:35 PM  OB FELLOW MAU DISCHARGE ATTESTATION  I have seen and examined this patient; I agree with above documentation in the resident's note.    Silvano Bilis, MD 8:18 AM

## 2015-12-01 NOTE — Progress Notes (Signed)
Dr. [place] called.  Phone answered by someone else stating Dr. [place] was in a delivery and would call back when finished.

## 2015-12-01 NOTE — Discharge Instructions (Signed)

## 2015-12-01 NOTE — Progress Notes (Signed)
Dr. [place] notified that wet prep results are back and pt wants to leave.  States he is on his way to discharge pt.

## 2015-12-01 NOTE — Progress Notes (Signed)
Dr. [place] notified of pt in MAU who is a G5P3 at 7437w1d.  Notified that pt complains of lower right side pain and has been leaking for 3 weeks.  Provider states he will go see the pt.

## 2015-12-01 NOTE — MAU Note (Signed)
Pain in RLQ for past 3 days, comes and goes.  Watery d/c for past 2 wks, has gotten heavier the past wk.

## 2015-12-01 NOTE — MAU Note (Signed)
Urine sent to lab 

## 2015-12-01 NOTE — Progress Notes (Cosign Needed)
History   CSN: [id number]  Arrival date & time 12/01/15  1347  None    Chief Complaint  Patient presents with  . Abdominal Pain  . Vaginal Discharge   HPI  Lynn Mckenzie is a 30 yo G3P3013 female at [redacted]w[redacted]d presenting to the MAU today complaining of right lower quadrant pain and discharge. The patient states her pain started 3 days ago, and has been intermittent since that time. The pain presents when the patient lies supine or on her R side when trying to sleep. The pain usually lasts 3-4 minutes and is resolved by moving. The patient states at its worst, the pain is 4/10, and 0/10 when not supine. The patient has not tried any medications or remedies to reliever her pain, as moving/sitting up resolves the pain. The patient denies trauma to the area, and can not recall any incident to cause injury to the area. She is worried about issues with the fetus, which is why she came to the MAU today.  The patient also is reporting scant, minimal clear, non-odorous, non-bloody fluid leaking intermittently over the past 3 weeks. The patient is unable to report an amount, but states that she does not soak through her underwear. She does not think the fluid is urine, as she is able to void independently throughout the day. The patient denies chest pain, SOB, palpitations, nausea, vomiting, diarrhea, constipation, recent illness, fever, loss of appetite, food intolerance, and sick contacts at home. She is very worried that her RLQ pain is a sign that something is wrong with the fetus.  Past Medical History  Diagnosis Date  . Medical history non-contributory   . Vaginal bleeding during pregnancy, antepartum 09/05/2014    Past Surgical History  Procedure Laterality Date  . Cesarean section      Family History  Problem Relation Age of Onset  . Hypertension Mother   . Diabetes Father   . Diabetes Brother     Social History  Substance Use Topics  . Smoking status: Never Smoker   . Smokeless  tobacco: Never Used  . Alcohol Use: No    OB History    Gravida Para Term Preterm AB TAB SAB Ectopic Multiple Living   Review of Systems  Constitutional: Negative for fever, chills, diaphoresis, activity change, appetite change and fatigue.  HENT: Negative for congestion, nosebleeds, sinus pressure and sore throat.   Eyes: Negative for visual disturbance.  Respiratory: Negative for cough, chest tightness, shortness of breath and wheezing.   Cardiovascular: Negative for chest pain, palpitations and leg swelling.  Gastrointestinal: Positive for abdominal pain. Negative for nausea, vomiting, diarrhea, constipation and anal bleeding.  Genitourinary: Positive for vaginal discharge. Negative for dysuria, frequency, hematuria, flank pain, vaginal bleeding, difficulty urinating, vaginal pain and pelvic pain.  Musculoskeletal: Negative for myalgias, back pain and joint swelling.  Skin: Negative for color change, pallor and rash.  Allergic/Immunologic: Negative for food allergies.  Neurological: Negative for dizziness, seizures, syncope, weakness, numbness and headaches.  Hematological: Negative for adenopathy.    Allergies  Review of patient's allergies indicates no known allergies.  Home Medications  No current outpatient prescriptions on file.  LMP 03/16/2015  Physical Exam  Constitutional: She is oriented to person, place, and time. She appears well-developed and well-nourished. No distress.  HENT:  Head: Normocephalic and atraumatic.  Neck: Normal range of motion. Neck supple. No thyromegaly present.  Cardiovascular: Normal rate, regular  rhythm, normal heart sounds and intact distal pulses.  Exam reveals no gallop and no friction rub.   No murmur heard. Pulmonary/Chest: Effort normal and breath sounds normal. No respiratory distress. She has no wheezes. She has no rales. She exhibits no tenderness.  Abdominal: Soft. Bowel sounds are normal. There is tenderness  in the right lower quadrant. There is no rigidity, no rebound, no guarding, no CVA tenderness, no tenderness at McBurney's point and negative Murphy's sign.  Musculoskeletal: Normal range of motion. She exhibits no edema or tenderness.  Lymphadenopathy:    She has no cervical adenopathy.  Neurological: She is alert and oriented to person, place, and time.  Skin: Skin is warm. No rash noted. She is not diaphoretic. No erythema. No pallor.  Psychiatric: She has a normal mood and affect. Her behavior is normal.    MAU Course  Procedures   Labs Reviewed - No data to display No results found.  1. Previous cesarean delivery affecting pregnancy, antepartum   2. Supervision of low-risk pregnancy, third trimester   3. History of VBAC    MDM  Third trimester pregnancy Pain in pregnancy - Encourage patient to sleep sitting up or on her L side to avoid causing pain - Ibuprofen or Tylenol if pain does not resolve - Reviewed course and signs of progressing labor with patient using teach-back; patient displayed understanding - Call or visit if symptoms worsen

## 2015-12-02 LAB — GC/CHLAMYDIA PROBE AMP (~~LOC~~) NOT AT ARMC
Chlamydia: NEGATIVE
NEISSERIA GONORRHEA: NEGATIVE

## 2015-12-21 ENCOUNTER — Inpatient Hospital Stay (HOSPITAL_COMMUNITY)
Admission: AD | Admit: 2015-12-21 | Discharge: 2015-12-21 | Disposition: A | Discharge status: Home / Self Care | Payer: Medicaid Other | Source: Ambulatory Visit | Attending: Obstetrics & Gynecology | Admitting: Obstetrics & Gynecology

## 2015-12-21 ENCOUNTER — Encounter (HOSPITAL_COMMUNITY): Payer: Self-pay | Admitting: *Deleted

## 2015-12-21 DIAGNOSIS — O479 False labor, unspecified: Secondary | ICD-10-CM

## 2015-12-21 DIAGNOSIS — Z98891 History of uterine scar from previous surgery: Secondary | ICD-10-CM

## 2015-12-21 DIAGNOSIS — O26893 Other specified pregnancy related conditions, third trimester: Secondary | ICD-10-CM | POA: Insufficient documentation

## 2015-12-21 DIAGNOSIS — Z3A40 40 weeks gestation of pregnancy: Secondary | ICD-10-CM | POA: Insufficient documentation

## 2015-12-21 DIAGNOSIS — O34219 Maternal care for unspecified type scar from previous cesarean delivery: Secondary | ICD-10-CM

## 2015-12-21 DIAGNOSIS — O471 False labor at or after 37 completed weeks of gestation: Secondary | ICD-10-CM

## 2015-12-21 DIAGNOSIS — O1203 Gestational edema, third trimester: Secondary | ICD-10-CM

## 2015-12-21 NOTE — Discharge Instructions (Signed)
Braxton Hicks Contractions °Contractions of the uterus can occur throughout pregnancy. Contractions are not always a sign that you are in labor.  °WHAT ARE BRAXTON HICKS CONTRACTIONS?  °Contractions that occur before labor are called Braxton Hicks contractions, or false labor. Toward the end of pregnancy (32-34 weeks), these contractions can develop more often and may become more forceful. This is not true labor because these contractions do not result in opening (dilatation) and thinning of the cervix. They are sometimes difficult to tell apart from true labor because these contractions can be forceful and people have different pain tolerances. You should not feel embarrassed if you go to the hospital with false labor. Sometimes, the only way to tell if you are in true labor is for your health care provider to look for changes in the cervix. °If there are no prenatal problems or other health problems associated with the pregnancy, it is completely safe to be sent home with false labor and await the onset of true labor. °HOW CAN YOU TELL THE DIFFERENCE BETWEEN TRUE AND FALSE LABOR? °False Labor °· The contractions of false labor are usually shorter and not as hard as those of true labor.   °· The contractions are usually irregular.   °· The contractions are often felt in the front of the lower abdomen and in the groin.   °· The contractions may go away when you walk around or change positions while lying down.   °· The contractions get weaker and are shorter lasting as time goes on.   °· The contractions do not usually become progressively stronger, regular, and closer together as with true labor.   °True Labor °1. Contractions in true labor last 30-70 seconds, become very regular, usually become more intense, and increase in frequency.   °2. The contractions do not go away with walking.   °3. The discomfort is usually felt in the top of the uterus and spreads to the lower abdomen and low back.   °4. True labor can  be determined by your health care provider with an exam. This will show that the cervix is dilating and getting thinner.   °WHAT TO REMEMBER °· Keep up with your usual exercises and follow other instructions given by your health care provider.   °· Take medicines as directed by your health care provider.   °· Keep your regular prenatal appointments.   °· Eat and drink lightly if you think you are going into labor.   °· If Braxton Hicks contractions are making you uncomfortable:   °· Change your position from lying down or resting to walking, or from walking to resting.   °· Sit and rest in a tub of warm water.   °· Drink 2-3 glasses of water. Dehydration may cause these contractions.   °· Do slow and deep breathing several times an hour.   °WHEN SHOULD I SEEK IMMEDIATE MEDICAL CARE? °Seek immediate medical care if: °· Your contractions become stronger, more regular, and closer together.   °· You have fluid leaking or gushing from your vagina.   °· You have a fever.   °· You pass blood-tinged mucus.   °· You have vaginal bleeding.   °· You have continuous abdominal pain.   °· You have low back pain that you never had before.   °· You feel your baby's head pushing down and causing pelvic pressure.   °· Your baby is not moving as much as it used to.   °  °This information is not intended to replace advice given to you by your health care provider. Make sure you discuss any questions you have with your health care   provider. °  °Document Released: 09/04/2005 Document Revised: 09/09/2013 Document Reviewed: 06/16/2013 °Elsevier Interactive Patient Education ©2016 [employer] ° °Fetal Movement Counts °Patient Name: __________________________________________________ Patient Due Date: ____________________ °Performing a fetal movement count is highly recommended in high-risk pregnancies, but it is good for every pregnant woman to do. Your health care provider may ask you to start counting fetal movements at 28 weeks of the  pregnancy. Fetal movements often increase: °· After eating a full meal. °· After physical activity. °· After eating or drinking something sweet or cold. °· At rest. °Pay attention to when you feel the baby is most active. This will help you notice a pattern of your baby's sleep and wake cycles and what factors contribute to an increase in fetal movement. It is important to perform a fetal movement count at the same time each day when your baby is normally most active.  °HOW TO COUNT FETAL MOVEMENTS °5. Find a quiet and comfortable area to sit or lie down on your left side. Lying on your left side provides the best blood and oxygen circulation to your baby. °6. Write down the day and time on a sheet of paper or in a journal. °7. Start counting kicks, flutters, swishes, rolls, or jabs in a 2-hour period. You should feel at least 10 movements within 2 hours. °8. If you do not feel 10 movements in 2 hours, wait 2-3 hours and count again. Look for a change in the pattern or not enough counts in 2 hours. °SEEK MEDICAL CARE IF: °· You feel less than 10 counts in 2 hours, tried twice. °· There is no movement in over an hour. °· The pattern is changing or taking longer each day to reach 10 counts in 2 hours. °· You feel the baby is not moving as he or she usually does. °Date: ____________ Movements: ____________ Start time: ____________ Finish time: ____________  °Date: ____________ Movements: ____________ Start time: ____________ Finish time: ____________ °Date: ____________ Movements: ____________ Start time: ____________ Finish time: ____________ °Date: ____________ Movements: ____________ Start time: ____________ Finish time: ____________ °Date: ____________ Movements: ____________ Start time: ____________ Finish time: ____________ °Date: ____________ Movements: ____________ Start time: ____________ Finish time: ____________ °Date: ____________ Movements: ____________ Start time: ____________ Finish time:  ____________ °Date: ____________ Movements: ____________ Start time: ____________ Finish time: ____________  °Date: ____________ Movements: ____________ Start time: ____________ Finish time: ____________ °Date: ____________ Movements: ____________ Start time: ____________ Finish time: ____________ °Date: ____________ Movements: ____________ Start time: ____________ Finish time: ____________ °Date: ____________ Movements: ____________ Start time: ____________ Finish time: ____________ °Date: ____________ Movements: ____________ Start time: ____________ Finish time: ____________ °Date: ____________ Movements: ____________ Start time: ____________ Finish time: ____________ °Date: ____________ Movements: ____________ Start time: ____________ Finish time: ____________  °Date: ____________ Movements: ____________ Start time: ____________ Finish time: ____________ °Date: ____________ Movements: ____________ Start time: ____________ Finish time: ____________ °Date: ____________ Movements: ____________ Start time: ____________ Finish time: ____________ °Date: ____________ Movements: ____________ Start time: ____________ Finish time: ____________ °Date: ____________ Movements: ____________ Start time: ____________ Finish time: ____________ °Date: ____________ Movements: ____________ Start time: ____________ Finish time: ____________ °Date: ____________ Movements: ____________ Start time: ____________ Finish time: ____________  °Date: ____________ Movements: ____________ Start time: ____________ Finish time: ____________ °Date: ____________ Movements: ____________ Start time: ____________ Finish time: ____________ °Date: ____________ Movements: ____________ Start time: ____________ Finish time: ____________ °Date: ____________ Movements: ____________ Start time: ____________ Finish time: ____________ °Date: ____________ Movements: ____________ Start time: ____________ Finish time: ____________ °Date: ____________ Movements:  ____________ Start time: ____________ Finish   time: ____________ °Date: ____________ Movements: ____________ Start time: ____________ Finish time: ____________  °Date: ____________ Movements: ____________ Start time: ____________ Finish time: ____________ °Date: ____________ Movements: ____________ Start time: ____________ Finish time: ____________ °Date: ____________ Movements: ____________ Start time: ____________ Finish time: ____________ °Date: ____________ Movements: ____________ Start time: ____________ Finish time: ____________ °Date: ____________ Movements: ____________ Start time: ____________ Finish time: ____________ °Date: ____________ Movements: ____________ Start time: ____________ Finish time: ____________ °Date: ____________ Movements: ____________ Start time: ____________ Finish time: ____________  °Date: ____________ Movements: ____________ Start time: ____________ Finish time: ____________ °Date: ____________ Movements: ____________ Start time: ____________ Finish time: ____________ °Date: ____________ Movements: ____________ Start time: ____________ Finish time: ____________ °Date: ____________ Movements: ____________ Start time: ____________ Finish time: ____________ °Date: ____________ Movements: ____________ Start time: ____________ Finish time: ____________ °Date: ____________ Movements: ____________ Start time: ____________ Finish time: ____________ °Date: ____________ Movements: ____________ Start time: ____________ Finish time: ____________  °Date: ____________ Movements: ____________ Start time: ____________ Finish time: ____________ °Date: ____________ Movements: ____________ Start time: ____________ Finish time: ____________ °Date: ____________ Movements: ____________ Start time: ____________ Finish time: ____________ °Date: ____________ Movements: ____________ Start time: ____________ Finish time: ____________ °Date: ____________ Movements: ____________ Start time: ____________ Finish  time: ____________ °Date: ____________ Movements: ____________ Start time: ____________ Finish time: ____________ °Date: ____________ Movements: ____________ Start time: ____________ Finish time: ____________  °Date: ____________ Movements: ____________ Start time: ____________ Finish time: ____________ °Date: ____________ Movements: ____________ Start time: ____________ Finish time: ____________ °Date: ____________ Movements: ____________ Start time: ____________ Finish time: ____________ °Date: ____________ Movements: ____________ Start time: ____________ Finish time: ____________ °Date: ____________ Movements: ____________ Start time: ____________ Finish time: ____________ °Date: ____________ Movements: ____________ Start time: ____________ Finish time: ____________ °  °This information is not intended to replace advice given to you by your health care provider. Make sure you discuss any questions you have with your health care provider. °  °Document Released: 10/04/2006 Document Revised: 09/25/2014 Document Reviewed: 07/01/2012 °Elsevier Interactive Patient Education ©2016 [employer] ° °

## 2015-12-21 NOTE — MAU Note (Addendum)
Pt was [facility] at [facility] for [facility] until February.  States she has been seen in the women's clinic 1 month ago, but was told there was an issue with insurance.  Pt did not return and states that she did not try to "fix" insurance problem and has been resting at home.  Pt complains of swelling in legs that started three days ago.  Pt denies RUQ pain, visual distrubances, and headaches.  Pt denies current abdominal pain.

## 2015-12-22 ENCOUNTER — Telehealth (HOSPITAL_COMMUNITY): Payer: Self-pay | Admitting: *Deleted

## 2015-12-22 NOTE — Telephone Encounter (Signed)
Preadmission screen  

## 2015-12-24 ENCOUNTER — Encounter (HOSPITAL_COMMUNITY): Payer: Self-pay | Admitting: *Deleted

## 2015-12-24 ENCOUNTER — Inpatient Hospital Stay (HOSPITAL_COMMUNITY): Payer: Medicaid Other | Admitting: Anesthesiology

## 2015-12-24 ENCOUNTER — Inpatient Hospital Stay (HOSPITAL_COMMUNITY)
Admission: AD | Admit: 2015-12-24 | Discharge: 2015-12-26 | DRG: 775 | Disposition: A | Discharge status: Home / Self Care | Payer: Medicaid Other | Source: Ambulatory Visit | Attending: Obstetrics & Gynecology | Admitting: Obstetrics & Gynecology

## 2015-12-24 ENCOUNTER — Telehealth (HOSPITAL_COMMUNITY): Payer: Self-pay | Admitting: *Deleted

## 2015-12-24 DIAGNOSIS — O34219 Maternal care for unspecified type scar from previous cesarean delivery: Secondary | ICD-10-CM | POA: Diagnosis not present

## 2015-12-24 DIAGNOSIS — Z833 Family history of diabetes mellitus: Secondary | ICD-10-CM

## 2015-12-24 DIAGNOSIS — Z8249 Family history of ischemic heart disease and other diseases of the circulatory system: Secondary | ICD-10-CM | POA: Diagnosis not present

## 2015-12-24 DIAGNOSIS — O34211 Maternal care for low transverse scar from previous cesarean delivery: Principal | ICD-10-CM | POA: Diagnosis present

## 2015-12-24 DIAGNOSIS — Z3A40 40 weeks gestation of pregnancy: Secondary | ICD-10-CM | POA: Diagnosis not present

## 2015-12-24 DIAGNOSIS — Z98891 History of uterine scar from previous surgery: Secondary | ICD-10-CM

## 2015-12-24 DIAGNOSIS — Z3493 Encounter for supervision of normal pregnancy, unspecified, third trimester: Secondary | ICD-10-CM

## 2015-12-24 LAB — CBC
HEMATOCRIT: 37.2 % (ref 36.0–46.0)
HEMOGLOBIN: 12.8 g/dL (ref 12.0–15.0)
MCH: 30.5 pg (ref 26.0–34.0)
MCHC: 34.4 g/dL (ref 30.0–36.0)
MCV: 88.8 fL (ref 78.0–100.0)
Platelets: 224 10*3/uL (ref 150–400)
RBC: 4.19 MIL/uL (ref 3.87–5.11)
RDW: 14.6 % (ref 11.5–15.5)
WBC: 12.6 10*3/uL — ABNORMAL HIGH (ref 4.0–10.5)

## 2015-12-24 LAB — TYPE AND SCREEN
ABO/RH(D): B POS
ANTIBODY SCREEN: NEGATIVE

## 2015-12-24 MED ORDER — OXYTOCIN 10 UNIT/ML IJ SOLN
2.5000 [IU]/h | INTRAVENOUS | Status: DC
  Filled 2015-12-24: qty 4

## 2015-12-24 MED ORDER — LACTATED RINGERS IV SOLN
500.0000 mL | Freq: Once | INTRAVENOUS | Status: AC
  Administered 2015-12-24: 500 mL via INTRAVENOUS

## 2015-12-24 MED ORDER — LACTATED RINGERS IV SOLN
INTRAVENOUS | Status: DC
  Administered 2015-12-24: 21:00:00 via INTRAVENOUS

## 2015-12-24 MED ORDER — FENTANYL 2.5 MCG/ML BUPIVACAINE 1/10 % EPIDURAL INFUSION (WH - ANES)
14.0000 mL/h | INTRAMUSCULAR | Status: DC | PRN
  Administered 2015-12-24 (×2): 14 mL/h via EPIDURAL
  Filled 2015-12-24: qty 125

## 2015-12-24 MED ORDER — LACTATED RINGERS IV SOLN: 500.0000 mL | INTRAVENOUS | Status: DC | PRN

## 2015-12-24 MED ORDER — OXYCODONE-ACETAMINOPHEN 5-325 MG PO TABS: 2.0000 | ORAL_TABLET | ORAL | Status: DC | PRN

## 2015-12-24 MED ORDER — LIDOCAINE HCL (PF) 1 % IJ SOLN
30.0000 mL | INTRAMUSCULAR | Status: DC | PRN
  Filled 2015-12-24: qty 30

## 2015-12-24 MED ORDER — EPHEDRINE 5 MG/ML INJ: 10.0000 mg | INTRAVENOUS | Status: DC | PRN

## 2015-12-24 MED ORDER — ONDANSETRON HCL 4 MG/2ML IJ SOLN: 4.0000 mg | Freq: Four times a day (QID) | INTRAMUSCULAR | Status: DC | PRN

## 2015-12-24 MED ORDER — LIDOCAINE HCL (PF) 1 % IJ SOLN
INTRAMUSCULAR | Status: DC | PRN
  Administered 2015-12-24 (×2): 4 mL

## 2015-12-24 MED ORDER — PHENYLEPHRINE 40 MCG/ML (10ML) SYRINGE FOR IV PUSH (FOR BLOOD PRESSURE SUPPORT): 80.0000 ug | PREFILLED_SYRINGE | INTRAVENOUS | Status: DC | PRN

## 2015-12-24 MED ORDER — FENTANYL 2.5 MCG/ML BUPIVACAINE 1/10 % EPIDURAL INFUSION (WH - ANES): 14.0000 mL/h | INTRAMUSCULAR | Status: DC | PRN

## 2015-12-24 MED ORDER — ACETAMINOPHEN 325 MG PO TABS: 650.0000 mg | ORAL_TABLET | ORAL | Status: DC | PRN

## 2015-12-24 MED ORDER — CITRIC ACID-SODIUM CITRATE 334-500 MG/5ML PO SOLN: 30.0000 mL | ORAL | Status: DC | PRN

## 2015-12-24 MED ORDER — OXYCODONE-ACETAMINOPHEN 5-325 MG PO TABS: 1.0000 | ORAL_TABLET | ORAL | Status: DC | PRN

## 2015-12-24 MED ORDER — OXYTOCIN BOLUS FROM INFUSION
500.0000 mL | INTRAVENOUS | Status: DC
  Administered 2015-12-25: 500 mL via INTRAVENOUS

## 2015-12-24 MED ORDER — FENTANYL CITRATE (PF) 100 MCG/2ML IJ SOLN: 50.0000 ug | INTRAMUSCULAR | Status: DC | PRN

## 2015-12-24 MED ORDER — PHENYLEPHRINE 40 MCG/ML (10ML) SYRINGE FOR IV PUSH (FOR BLOOD PRESSURE SUPPORT)
80.0000 ug | PREFILLED_SYRINGE | INTRAVENOUS | Status: DC | PRN
  Filled 2015-12-24: qty 20

## 2015-12-24 MED ORDER — DIPHENHYDRAMINE HCL 50 MG/ML IJ SOLN: 12.5000 mg | INTRAMUSCULAR | Status: DC | PRN

## 2015-12-24 MED ORDER — FLEET ENEMA 7-19 GM/118ML RE ENEM: 1.0000 | ENEMA | RECTAL | Status: DC | PRN

## 2015-12-24 NOTE — Telephone Encounter (Signed)
Preadmission screen  

## 2015-12-24 NOTE — H&P (Signed)
LABOR ADMISSION HISTORY AND PHYSICAL  Lynn LinSamirah Mckenzie is a 30 y.o. female [phone] with IUP at 2161w3d by LMP presenting for onset of labor. She reports +FMs.  Denies LOF, VB, blurry vision, headaches, peripheral edema, RUQ pain.  She plans on breast feeding. She is undecided for birth control.  Pl[facility] on [facility] for pediatric care after discharge.  She notes that she used to go to [facility] for care but stopped going in February secondary to loss of insurance.  She never reestablished with new provider.  No issues during pregnancy.  Of note patient has a PMH of c-section with her second child.  She had a successful VBAC with her third child.  Dating: By LMP --->  Estimated Date of Delivery: 12/21/15  Sono:    @[redacted]w[redacted]d , normal anatomy, marginal cord insertion, cephalic presentation, 1220g, 82%74% EFW  Prenatal History/Complications: Clinic Low Risk > transfer [facility] Prenatal Labs  Dating LMP and U/S Blood type: B/Positive/-- (09/06 0000)   Genetic Screen 1 Screen: normal AFP: negative Antibody:Negative (09/06 0000)  Anatomic US 17 wks, marginal cord insertion otherwise normal Rubella: Immune (09/06 0000)  GTT  Third trimester: 135 RPR: Nonreactive (01/13 0000)   Flu vaccine January 2017 HBsAg: Negative (09/06 0000)   TDaP vaccine Received at [facility]  HIV: Non-reactive (09/06 0000)   Baby Food  Breast  GBS: (For PCN allergy, check sensitivities) Neg  Contraception Natural Family Planning method Pap:12/2013 negative [facility] per pt report  Circumcision n/a female Hgb electrophoresis: normal  Pediatrician [facility]   Support Person Bandar Aljuaid (FOB)       Past Medical History: Past Medical History  Diagnosis Date  . Medical history non-contributory   . Vaginal bleeding during pregnancy, antepartum 09/05/2014    Past  Surgical History: Past Surgical History  Procedure Laterality Date  . Cesarean section      Obstetrical History: OB History    Gravida Para Term Preterm AB TAB SAB Ectopic Multiple Living   5 3 3  1  1   3       Social History: Social History   Social History  . Marital Status: Married    Spouse Name: N/A  . Number of Children: N/A  . Years of Education: N/A   Social History Main Topics  . Smoking status: Never Smoker   . Smokeless tobacco: Never Used  . Alcohol Use: No  . Drug Use: No  . Sexual Activity: Yes    Birth Control/ Protection: None   Other Topics Concern  . None   Social History Narrative    Family History: Family History  Problem Relation Age of Onset  . Hypertension Mother   . Diabetes Father   . Diabetes Brother     Allergies: No Known Allergies  Prescriptions prior to admission  Medication Sig Dispense Refill Last Dose  . ferrous sulfate 325 (65 FE) MG tablet Take 325 mg by mouth daily with breakfast.   12/23/2015 at Unknown time  . Prenatal Vit-Fe Fumarate-FA (PRENATAL MULTIVITAMIN) TABS tablet Take 1 tablet by mouth daily at 12 noon.   12/23/2015 at Unknown time   Review of Systems   All systems reviewed and negative except as stated in HPI  Blood pressure 96/64, pulse 98, temperature 98.2 F (36.8 C), temperature source Oral, resp. rate 18, last menstrual period 03/16/2015, unknown if currently breastfeeding. General appearance: alert, cooperative, appears stated age and uncomfortable during contractions. Lungs: clear to auscultation bilaterally, no increased WOB  Heart: regular rate and rhythm, no m/r/g Abdomen: soft, non-tender; bowel sounds normal Pelvic: Dilation: 4.5 Effacement (%): 70 Cervical Position: Posterior Station: -3 Presentation: Vertex Exam by:: Orinda Kenner RN Extremities: WWP, Homans sign is negative, no sign of DVT, +2 DP Neuro: follows commands, no clonus Presentation: cephalic Fetal  monitoringBaseline: 140 bpm, Variability: Good {> 6 bpm), Accelerations: Reactive and Decelerations: Absent Uterine activityFrequency: Every 4-8 minutes    Prenatal labs: ABO, Rh: B/Positive/-- (09/06 0000) Antibody: Negative (09/06 0000) Rubella: !Error!IMMUNE RPR: Nonreactive (01/13 0000)  HBsAg: Negative (09/06 0000)  HIV: Non-reactive (09/06 0000)  GBS: Negative (03/07 0000)  1 hr Glucola 135 Genetic screening  AFP negative Anatomy US normal  Prenatal Transfer Tool  Maternal Diabetes: No Genetic Screening: Normal Maternal Ultrasounds/Referrals: Normal Fetal Ultrasounds or other Referrals:  None Maternal Substance Abuse:  No Significant Maternal Medications:  None Significant Maternal Lab Results: Lab values include: Group B Strep negative  No results found for this or any previous visit (from the past 24 hour(s)).  Patient Active Problem List   Diagnosis Date Noted  . Vaginal discharge during pregnancy 12/01/2015  . Previous cesarean delivery affecting pregnancy, antepartum 11/23/2015  . Supervision of low-risk pregnancy 11/16/2015  . History of VBAC 11/16/2015    Assessment: Lynn Mckenzie is a 30 y.o. [id number] at [redacted]w[redacted]d here for onset of labor  #Labor: anticipate SVD #Pain: Fentanyl IV  prn, no epidural #FWB: Cat 1 #ID:  GBS neg #MOF: Breast #MOC:undecided #Circ:  N/a, girl  Delynn Flavin, DO  PGY-2, [facility] 12/24/2015, 8:48 PM  CNM attestation:  I have seen and examined this patient; I agree with above documentation in the resident's note.   Lynn Mckenzie is a 30 y.o. [id number] here for early labor/TOLAC  PE: BP 103/62 mmHg  Pulse 66  Temp(Src) 99.5 F (37.5 C) (Oral)  Resp 20  Ht  (1.676 m)  Wt 95.255 kg (210 lb)  BMI 33.91 kg/m2  SpO2 98%  LMP 03/16/2015  Resp: normal effort, no distress Abd: gravid  ROS, labs, PMH reviewed  Plan: Admit to [facility] Expectant management Anticipate  SVD  Cam Hai CNM 12/25/2015, 12:02 AM

## 2015-12-24 NOTE — Anesthesia Preprocedure Evaluation (Signed)
Anesthesia Evaluation  Patient identified by MRN, date of birth, ID band Patient awake    Reviewed: Allergy & Precautions, NPO status , Patient's Chart, lab work & pertinent test results  Airway Mallampati: II  TM Distance: >3 FB Neck ROM: Full    Dental no notable dental hx.    Pulmonary neg pulmonary ROS,    Pulmonary exam normal breath sounds clear to auscultation       Cardiovascular negative cardio ROS Normal cardiovascular exam Rhythm:Regular Rate:Normal     Neuro/Psych negative neurological ROS  negative psych ROS   GI/Hepatic negative GI ROS, Neg liver ROS,   Endo/Other  negative endocrine ROS  Renal/GU negative Renal ROS     Musculoskeletal negative musculoskeletal ROS (+)   Abdominal   Peds  Hematology negative hematology ROS (+)   Anesthesia Other Findings   Reproductive/Obstetrics (+) Pregnancy                             Anesthesia Physical Anesthesia Plan  ASA: II  Anesthesia Plan: Epidural   Post-op Pain Management:    Induction:   Airway Management Planned:   Additional Equipment:   Intra-op Plan:   Post-operative Plan:   Informed Consent: I have reviewed the patients History and Physical, chart, labs and discussed the procedure including the risks, benefits and alternatives for the proposed anesthesia with the patient or authorized representative who has indicated his/her understanding and acceptance.     Plan Discussed with:   Anesthesia Plan Comments:         Anesthesia Quick Evaluation  

## 2015-12-24 NOTE — Progress Notes (Signed)
LABOR PROGRESS NOTE  Lynn Mckenzie is a 30 y.o. [id number] at 1436w3d  admitted for onset of labor  Subjective: Patient reports that she is much more comfortable after epidural.  No concerns at this time.  Objective: BP 115/65 mmHg  Pulse 87  Temp(Src) 99.5 F (37.5 C) (Oral)  Resp 20  Ht 5\' 6"  (1.676 m)  Wt 210 lb (95.255 kg)  BMI 33.91 kg/m2  SpO2 100%  LMP 03/16/2015 or  Filed Vitals:   12/24/15 2227 12/24/15 2230 12/24/15 2235 12/24/15 2240  BP: 112/63 111/64 113/69 115/65  Pulse: 107 89 80 87  Temp:      TempSrc:      Resp:      Height:      Weight:      SpO2: 100%      Gen: awake, alert, comfortable appearing  Fetal monitoringBaseline: 145 bpm, Variability: Good {> 6 bpm) and Accelerations: Reactive Uterine activityFrequency: Every 2-3 minutes    Dilation: 4.5 Effacement (%): 70 Cervical Position: Posterior Station: -3 Presentation: Vertex Exam by:: Orinda KennerKimberly Whitehurst Boyd RN  Labs: Lab Results  Component Value Date   WBC 12.6* 12/24/2015   HGB 12.8 12/24/2015   HCT 37.2 12/24/2015   MCV 88.8 12/24/2015   PLT 224 12/24/2015    Patient Active Problem List   Diagnosis Date Noted  . Indication for care in labor or delivery 12/24/2015  . Vaginal discharge during pregnancy 12/01/2015  . Previous cesarean delivery affecting pregnancy, antepartum 11/23/2015  . Supervision of low-risk pregnancy 11/16/2015  . History of VBAC 11/16/2015    Assessment / Plan: 30 y.o. [id number] at 7636w3d here for onset of labor  Labor: progressing well.  SROM @ 2227 Fetal Wellbeing:  Cat 1 Pain Control:  Epidural Anticipated MOD:  SVD  [name], DO 12/24/2015, 11:23 PM

## 2015-12-24 NOTE — MAU Note (Signed)
Contractions since 1500, denies bleeding or LOF.

## 2015-12-24 NOTE — Anesthesia Procedure Notes (Signed)
Epidural Patient location during procedure: OB  Staffing Anesthesiologist: Tamsen Reist Performed by: anesthesiologist   Preanesthetic Checklist Completed: patient identified, pre-op evaluation, timeout performed, IV checked, risks and benefits discussed and monitors and equipment checked  Epidural Patient position: sitting Prep: site prepped and draped and DuraPrep Patient monitoring: heart rate Approach: midline Location: L3-L4 Injection technique: LOR air and LOR saline  Needle:  Needle type: Tuohy  Needle gauge: 17 G Needle length: 9 cm Needle insertion depth: 9 cm Catheter type: closed end flexible Catheter size: 19 Gauge Catheter at skin depth: 15 cm Test dose: negative  Assessment Sensory level: T8 Events: blood not aspirated, injection not painful, no injection resistance, negative IV test and no paresthesia  Additional Notes Reason for block:procedure for pain   

## 2015-12-25 ENCOUNTER — Encounter (HOSPITAL_COMMUNITY): Payer: Self-pay | Admitting: Emergency Medicine

## 2015-12-25 DIAGNOSIS — O34219 Maternal care for unspecified type scar from previous cesarean delivery: Secondary | ICD-10-CM | POA: Diagnosis not present

## 2015-12-25 DIAGNOSIS — Z3A40 40 weeks gestation of pregnancy: Secondary | ICD-10-CM

## 2015-12-25 LAB — RPR: RPR Ser Ql: NONREACTIVE

## 2015-12-25 MED ORDER — LANOLIN HYDROUS EX OINT: TOPICAL_OINTMENT | CUTANEOUS | Status: DC | PRN

## 2015-12-25 MED ORDER — ONDANSETRON HCL 4 MG/2ML IJ SOLN: 4.0000 mg | INTRAMUSCULAR | Status: DC | PRN

## 2015-12-25 MED ORDER — DIPHENHYDRAMINE HCL 25 MG PO CAPS: 25.0000 mg | ORAL_CAPSULE | Freq: Four times a day (QID) | ORAL | Status: DC | PRN

## 2015-12-25 MED ORDER — WITCH HAZEL-GLYCERIN EX PADS: 1.0000 "application " | MEDICATED_PAD | CUTANEOUS | Status: DC | PRN

## 2015-12-25 MED ORDER — TETANUS-DIPHTH-ACELL PERTUSSIS 5-2.5-18.5 LF-MCG/0.5 IM SUSP: 0.5000 mL | Freq: Once | INTRAMUSCULAR | Status: DC

## 2015-12-25 MED ORDER — SENNOSIDES-DOCUSATE SODIUM 8.6-50 MG PO TABS
2.0000 | ORAL_TABLET | ORAL | Status: DC
  Administered 2015-12-26: 2 via ORAL
  Filled 2015-12-25: qty 2

## 2015-12-25 MED ORDER — OXYCODONE HCL 5 MG PO TABS: 10.0000 mg | ORAL_TABLET | ORAL | Status: DC | PRN

## 2015-12-25 MED ORDER — DIBUCAINE 1 % RE OINT: 1.0000 "application " | TOPICAL_OINTMENT | RECTAL | Status: DC | PRN

## 2015-12-25 MED ORDER — SIMETHICONE 80 MG PO CHEW: 80.0000 mg | CHEWABLE_TABLET | ORAL | Status: DC | PRN

## 2015-12-25 MED ORDER — BENZOCAINE-MENTHOL 20-0.5 % EX AERO: 1.0000 "application " | INHALATION_SPRAY | CUTANEOUS | Status: DC | PRN

## 2015-12-25 MED ORDER — IBUPROFEN 600 MG PO TABS
600.0000 mg | ORAL_TABLET | Freq: Four times a day (QID) | ORAL | Status: DC
  Administered 2015-12-25 – 2015-12-26 (×6): 600 mg via ORAL
  Filled 2015-12-25 (×6): qty 1

## 2015-12-25 MED ORDER — ACETAMINOPHEN 325 MG PO TABS: 650.0000 mg | ORAL_TABLET | ORAL | Status: DC | PRN

## 2015-12-25 MED ORDER — ZOLPIDEM TARTRATE 5 MG PO TABS: 5.0000 mg | ORAL_TABLET | Freq: Every evening | ORAL | Status: DC | PRN

## 2015-12-25 MED ORDER — OXYCODONE HCL 5 MG PO TABS: 5.0000 mg | ORAL_TABLET | ORAL | Status: DC | PRN

## 2015-12-25 MED ORDER — ONDANSETRON HCL 4 MG PO TABS: 4.0000 mg | ORAL_TABLET | ORAL | Status: DC | PRN

## 2015-12-25 MED ORDER — PRENATAL MULTIVITAMIN CH
1.0000 | ORAL_TABLET | Freq: Every day | ORAL | Status: DC
Start: 2015-12-25 — End: 2015-12-26
  Administered 2015-12-25 – 2015-12-26 (×2): 1 via ORAL
  Filled 2015-12-25 (×2): qty 1

## 2015-12-25 NOTE — Anesthesia Postprocedure Evaluation (Signed)
Anesthesia Post Note  Patient: Lynn Mckenzie  Procedure(s) Performed: * No procedures listed *  Patient location during evaluation: Women's Unit Anesthesia Type: Epidural Level of consciousness: awake and alert and oriented Pain management: pain level controlled Vital Signs Assessment: post-procedure vital signs reviewed and stable Respiratory status: spontaneous breathing Cardiovascular status: blood pressure returned to baseline Postop Assessment: no headache, no backache, patient able to bend at knees, no signs of nausea or vomiting and adequate PO intake Anesthetic complications: no    Last Vitals:  Filed Vitals:   12/25/15 0830 12/25/15 1200  BP: 101/55 99/56  Pulse: 72 79  Temp: 37.1 C 37.2 C  Resp: 18 18    Last Pain:  Filed Vitals:   12/25/15 1215  PainSc: 0-No pain                 Donni Oglesby

## 2015-12-25 NOTE — Lactation Note (Signed)
This note was copied from a baby's chart. Lactation Consultation Note  Patient Name: Lynn Tami LinSamirah Sou RUEAV'WToday's Date: 12/25/2015 Reason for consult: Initial assessment Visited with MOB, at 11 post delivery.  This is 4th baby to breast feed, and MOB states baby is latching and feeding well.  Prior latch score of 10 given.  Reviewed basics.  Encouraged skin to skin, and feeding often on cue.  Demonstrated manual breast expression, and recommended she do this prior to latch.  Brochure left with Mom.  Informed Mom of IP and OP lactation services available to her.  Encouraged to call for assistance as needed.  Follow up in am.  Consult Status Consult Status: Follow-up Date: 12/26/15 Follow-up type: In-patient    Lynn Mckenzie, Lynn Mckenzie 12/25/2015, 12:48 PM

## 2015-12-26 LAB — CBC
HEMATOCRIT: 34.7 % — AB (ref 36.0–46.0)
Hemoglobin: 11.6 g/dL — ABNORMAL LOW (ref 12.0–15.0)
MCH: 30.3 pg (ref 26.0–34.0)
MCHC: 33.4 g/dL (ref 30.0–36.0)
MCV: 90.6 fL (ref 78.0–100.0)
PLATELETS: 202 10*3/uL (ref 150–400)
RBC: 3.83 MIL/uL — ABNORMAL LOW (ref 3.87–5.11)
RDW: 15 % (ref 11.5–15.5)
WBC: 9.4 10*3/uL (ref 4.0–10.5)

## 2015-12-26 MED ORDER — ACETAMINOPHEN 325 MG PO TABS: 650.0000 mg | ORAL_TABLET | ORAL | Status: DC | PRN

## 2015-12-26 MED ORDER — IBUPROFEN 600 MG PO TABS: 600.0000 mg | ORAL_TABLET | Freq: Four times a day (QID) | ORAL | Status: DC

## 2015-12-26 NOTE — Progress Notes (Signed)

## 2015-12-26 NOTE — Lactation Note (Signed)
This note was copied from a baby's chart. Lactation Consultation Note  Patient Name: Lynn Mckenzie ZOXWR'UToday's Date: 12/26/2015 Reason for consult: Follow-up assessment   Follow-up consult at 4335 hrs old.  Ga 40.4; BW 8 lbs, 0.4 oz; Weight Loss 4%.  LS-10 by RN. Mom P4. Infant has breastfed x9 (15-20 min); voids-5; stools-3.   Mom c/o slight pain with breastfeeding; explained the need for wide latched.  Used Baby and Me Booklet for teaching and pictures.  Encouraged mom to use chin tug and manual flanging of lips with latching.  Infant currently asleep and not showing cues to feed.  Reviewed the need to BF 8-12x per day.  Comfort gels given and explained how to use.  Mom put Cgels on and stated they felt good. Mom asked questions about pumping and thawing frozen BM.  Baby and Me booklet used for teaching. Mom has manual pump for use at home.  Discussed pumping to relieve pressure and prevent engorgement.  Milk storage discussed. Encouraged mom to attend hospital support group and call for questions or OP service if needed after discharge.   Maternal Data    Feeding Feeding Type: Breast Fed Length of feed: 20 min  LATCH Score/Interventions       Type of Nipple: Everted at rest and after stimulation  Comfort (Breast/Nipple): Filling, red/small blisters or bruises, mild/mod discomfort  Problem noted: Mild/Moderate discomfort Interventions (Mild/moderate discomfort): Comfort gels        Lactation Tools Discussed/Used Pump Review: Milk Storage   Consult Status Consult Status: Complete    Lendon KaVann, Nakiyah Beverley Walker 12/26/2015, 2:01 PM

## 2015-12-26 NOTE — Discharge Instructions (Signed)

## 2015-12-26 NOTE — Discharge Summary (Signed)
OB Discharge Summary     Patient Name: Gracey Tolle DOB: 25-Jul-1986 MRN: [id number]  Date of admission: 12/24/2015 Delivering MD: Raliegh Ip   Date of discharge: 12/26/2015  Admitting diagnosis: 41WKS LABOR PAIN Intrauterine pregnancy: [redacted]w[redacted]d     Secondary diagnosis:  Principal Problem:   Indication for care in labor or delivery Active Problems:   VBAC, delivered, current hospitalization  Additional problems: none     Discharge diagnosis: Term Pregnancy Delivered and VBAC                                                                                                Post partum procedures:none  Augmentation: AROM  Complications: None  Hospital course:  Onset of Labor With Vaginal Delivery     30 y.o. yo [id number] at [redacted]w[redacted]d was admitted in Latent Labor on 12/24/2015. Patient had an uncomplicated labor course as follows:  Membrane Rupture Time/Date: 10:27 PM ,12/24/2015   Intrapartum Procedures: Episiotomy: None [1]                                         Lacerations:  None [1]  Patient had a delivery of a Viable infant. 12/25/2015  Information for the patient's newborn:  Laniesha, Das [[id number]]  Delivery Method: Vag-Spont    Pateint had an uncomplicated postpartum course.  She is ambulating, tolerating a regular diet, passing flatus, and urinating well. Patient is discharged home in stable condition on 12/26/2015.    Physical exam  Filed Vitals:   12/25/15 1200 12/25/15 1830 12/25/15 2340 12/26/15 0617  BP: 99/56 101/52 99/62 103/66  Pulse: 79 72 79 68  Temp: 99 F (37.2 C) 98.7 F (37.1 C) 99.2 F (37.3 C) 98.5 F (36.9 C)  TempSrc: Oral Oral Oral Oral  Resp: Height:      Weight:      SpO2: 95% 99% 99% 99%   General: alert, cooperative and no distress Lochia: appropriate Uterine Fundus: firm Incision: N/A DVT Evaluation: No cords or calf tenderness. No significant calf/ankle edema. Labs: Lab Results  Component Value Date   WBC 9.4 12/26/2015   HGB 11.6* 12/26/2015   HCT 34.7* 12/26/2015   MCV 90.6 12/26/2015   PLT 202 12/26/2015   No flowsheet data found.  Discharge instruction: per After Visit Summary and "Baby and Me Booklet".  After visit meds:    Medication List    STOP taking these medications        ferrous sulfate 325 (65 FE) MG tablet      TAKE these medications        acetaminophen 325 MG tablet  Commonly known as:  TYLENOL  Take 2 tablets (650 mg total) by mouth every 4 (four) hours as needed (for pain scale < 4).     ibuprofen 600 MG tablet  Commonly known as:  ADVIL,MOTRIN  Take 1 tablet (600 mg total) by mouth every 6 (six) hours.     prenatal multivitamin Tabs tablet  Take 1 tablet by mouth daily at 12 noon.        Diet: routine diet  Activity: Advance as tolerated. Pelvic rest for 6 weeks.   Outpatient follow up:6 weeks Follow up Appt:Future Appointments Date Time Provider Department Center  12/28/2015 7:30 AM [facility] RO[facility] None  01/13/2016 1:10 PM Kathrynn RunningNoah Bedford Vyolet Sakuma, MD [facility]   Follow up Visit:No Follow-up on file.  Postpartum contraception: Natural Family Planning  Newborn Data: Live born female  Birth Weight: 8 lb 0.4 oz (3640 g) APGAR: 9, 9  Baby Feeding: Breast Disposition:home with mother   12/26/2015 Silvano BilisNoah B Mersades Barbaro, MD

## 2015-12-27 ENCOUNTER — Other Ambulatory Visit: Payer: Self-pay

## 2015-12-28 ENCOUNTER — Inpatient Hospital Stay (HOSPITAL_COMMUNITY): Admit: 2015-12-28 | Payer: Medicaid Other

## 2016-01-13 ENCOUNTER — Ambulatory Visit: Payer: Self-pay | Admitting: Obstetrics and Gynecology

## 2016-01-22 ENCOUNTER — Emergency Department (HOSPITAL_COMMUNITY)
Admission: EM | Admit: 2016-01-22 | Discharge: 2016-01-22 | Disposition: A | Discharge status: Home / Self Care | Payer: Medicaid Other | Attending: Emergency Medicine | Admitting: Emergency Medicine

## 2016-01-22 ENCOUNTER — Encounter (HOSPITAL_COMMUNITY): Payer: Self-pay | Admitting: *Deleted

## 2016-01-22 ENCOUNTER — Emergency Department (HOSPITAL_COMMUNITY): Payer: Medicaid Other

## 2016-01-22 DIAGNOSIS — S80212A Abrasion, left knee, initial encounter: Secondary | ICD-10-CM | POA: Diagnosis present

## 2016-01-22 DIAGNOSIS — Z23 Encounter for immunization: Secondary | ICD-10-CM | POA: Diagnosis not present

## 2016-01-22 DIAGNOSIS — S80211A Abrasion, right knee, initial encounter: Secondary | ICD-10-CM | POA: Insufficient documentation

## 2016-01-22 DIAGNOSIS — Z79899 Other long term (current) drug therapy: Secondary | ICD-10-CM | POA: Diagnosis not present

## 2016-01-22 DIAGNOSIS — Z791 Long term (current) use of non-steroidal anti-inflammatories (NSAID): Secondary | ICD-10-CM | POA: Diagnosis not present

## 2016-01-22 DIAGNOSIS — Y9302 Activity, running: Secondary | ICD-10-CM | POA: Diagnosis not present

## 2016-01-22 DIAGNOSIS — W01198A Fall on same level from slipping, tripping and stumbling with subsequent striking against other object, initial encounter: Secondary | ICD-10-CM | POA: Diagnosis not present

## 2016-01-22 DIAGNOSIS — Y9289 Other specified places as the place of occurrence of the external cause: Secondary | ICD-10-CM | POA: Diagnosis not present

## 2016-01-22 DIAGNOSIS — Y998 Other external cause status: Secondary | ICD-10-CM | POA: Insufficient documentation

## 2016-01-22 MED ORDER — ACETAMINOPHEN 325 MG PO TABS
650.0000 mg | ORAL_TABLET | Freq: Once | ORAL | Status: AC
  Administered 2016-01-22: 650 mg via ORAL
  Filled 2016-01-22: qty 2

## 2016-01-22 MED ORDER — SODIUM CHLORIDE 0.9 % IR SOLN
Freq: Once | Status: AC
  Administered 2016-01-22: 22:00:00
  Filled 2016-01-22: qty 1

## 2016-01-22 MED ORDER — TETANUS-DIPHTH-ACELL PERTUSSIS 5-2.5-18.5 LF-MCG/0.5 IM SUSP
0.5000 mL | Freq: Once | INTRAMUSCULAR | Status: AC
  Administered 2016-01-22: 0.5 mL via INTRAMUSCULAR
  Filled 2016-01-22: qty 0.5

## 2016-01-22 NOTE — ED Notes (Signed)
Patient states she was running with a stroller something fell out and she bent down to get it and fell onto her knees.  Abrasions to knees bilaterally.  Also states she feels hot inside.  Took temp at home and it was 97

## 2016-01-22 NOTE — Discharge Instructions (Signed)
Use neosporin on the areas. Return as needed. Take tylenol as needed for pain

## 2016-01-22 NOTE — ED Provider Notes (Signed)
CSN: [id number]     Arrival date & time 01/22/16  2022 History   First MD Initiated Contact with Patient 01/22/16 2118     Chief Complaint  Patient presents with  . Fall  . Abrasion     (Consider location/radiation/quality/duration/timing/severity/associated sxs/prior Treatment) HPI Lynn Mckenzie is a 30 y.o. female who presents to the ED with abrasions to bilateral knees. She reports that she was running with a stroller and something fell out and she bent down to get it and fell on her knees. She denies any other injuries. She is unsure of her last tetanus.   Past Medical History  Diagnosis Date  . Medical history non-contributory   . Vaginal bleeding during pregnancy, antepartum 09/05/2014   Past Surgical History  Procedure Laterality Date  . Cesarean section     Family History  Problem Relation Age of Onset  . Hypertension Mother   . Diabetes Father   . Diabetes Brother    Social History  Substance Use Topics  . Smoking status: Never Smoker   . Smokeless tobacco: Never Used  . Alcohol Use: No   OB History    Gravida Para Term Preterm AB TAB SAB Ectopic Multiple Living   5 4 4  1  1   0 4     Review of Systems Negative except as stated in HPI   Allergies  Review of patient's allergies indicates no known allergies.  Home Medications   Prior to Admission medications   Medication Sig Start Date End Date Taking? Authorizing Provider  acetaminophen (TYLENOL) 325 MG tablet Take 2 tablets (650 mg total) by mouth every 4 (four) hours as needed (for pain scale < 4). 12/26/15   Kathrynn RunningNoah Bedford Wouk, MD  ibuprofen (ADVIL,MOTRIN) 600 MG tablet Take 1 tablet (600 mg total) by mouth every 6 (six) hours. 12/26/15   Kathrynn RunningNoah Bedford Wouk, MD  Prenatal Vit-Fe Fumarate-FA (PRENATAL MULTIVITAMIN) TABS tablet Take 1 tablet by mouth daily at 12 noon.    Historical Provider, MD   BP 112/74 mmHg  Pulse 88  Temp(Src) 98.1 F (36.7 C) (Oral)  Resp 14  Ht 5\' 6"  (1.676 m)  Wt 90.719 kg   BMI 32.30 kg/m2  SpO2 95% Physical Exam  Constitutional: She is oriented to person, place, and time. She appears well-developed and well-nourished.  HENT:  Head: Normocephalic and atraumatic.  Eyes: Conjunctivae and EOM are normal.  Neck: Neck supple.  Cardiovascular: Normal rate.   Pulmonary/Chest: Effort normal.  Musculoskeletal:       Left knee: She exhibits normal range of motion and no erythema. Swelling: minimal. Tenderness found.       Legs: Abrasions noted bilateral knees, anterior aspect. Pain with palpation to the left lateral knee. Full passive range of motion with minimal pain.   Neurological: She is alert and oriented to person, place, and time. No cranial nerve deficit.  Skin: Skin is warm and dry.  Psychiatric: She has a normal mood and affect. Her behavior is normal.  Nursing note and vitals reviewed.   ED Course  Procedures (including critical care time) Labs Review Labs Reviewed - No data to display  Imaging Review Dg Knee Complete 4 Views Left  01/22/2016  CLINICAL DATA:  Larey SeatFell onto both knees today, LEFT knee pain more severe than RIGHT, anterior pain, pain with weight-bearing and bending knee, initial encounter EXAM: LEFT KNEE - COMPLETE 4+ VIEW COMPARISON:  None FINDINGS: Bone mineralization normal. Joint spaces preserved. No fracture, dislocation, or bone destruction. No  joint effusion. Anterior soft tissue swelling noted. IMPRESSION: No acute bony abnormalities. Electronically Signed   By: Ulyses Southward M.D.   On: 01/22/2016 21:48   I have personally reviewed and evaluated these images and lab results as part of my medical decision-making.   MDM  30 y.o. female with bilateral knee abrasions stable for d/c without difficulty ambulating and normal x-rays of the left knee. Wounds cleaned, bacitracin ointment and dressing. Tylenol as needed for pain.  Discussed with the patient and all questioned fully answered. She will return if any problems arise.   Final  diagnoses:  Abrasion, knee, left, initial encounter  Abrasion, knee, right, initial encounter       [facility], NP 01/22/16 2211  Benjiman Core, MD 01/22/16 [phone]

## 2016-02-11 ENCOUNTER — Encounter: Payer: Self-pay | Admitting: Obstetrics & Gynecology

## 2016-02-11 ENCOUNTER — Other Ambulatory Visit (HOSPITAL_COMMUNITY)
Admission: RE | Admit: 2016-02-11 | Discharge: 2016-02-11 | Disposition: A | Discharge status: Home / Self Care | Payer: Medicaid Other | Source: Ambulatory Visit | Attending: Obstetrics & Gynecology | Admitting: Obstetrics & Gynecology

## 2016-02-11 ENCOUNTER — Ambulatory Visit (INDEPENDENT_AMBULATORY_CARE_PROVIDER_SITE_OTHER): Payer: Medicaid Other | Admitting: Obstetrics & Gynecology

## 2016-02-11 DIAGNOSIS — Z1151 Encounter for screening for human papillomavirus (HPV): Secondary | ICD-10-CM | POA: Diagnosis present

## 2016-02-11 DIAGNOSIS — Z124 Encounter for screening for malignant neoplasm of cervix: Secondary | ICD-10-CM

## 2016-02-11 DIAGNOSIS — Z01419 Encounter for gynecological examination (general) (routine) without abnormal findings: Secondary | ICD-10-CM | POA: Diagnosis present

## 2016-02-11 NOTE — Progress Notes (Signed)
Subjective:     Lynn LinSamirah Mckenzie is a 30 y.o. female who presents for a postpartum visit. She is 7 weeks postpartum following a spontaneous vaginal delivery. I have fully reviewed the prenatal and intrapartum course. The delivery was at 40.4 gestational weeks. Outcome: vaginal birth after cesarean (VBAC). Anesthesia: epidural. Postpartum course has been unremarkable. Baby's course has been unremarkable. Baby is feeding by breast. Bleeding no bleeding. Bowel function is normal. Bladder function is normal. Patient is not sexually active. Contraception method is none. Patient plans [employer]. Postpartum depression screening: negative.  The following portions of the patient's history were reviewed and updated as appropriate: allergies, current medications, past family history, past medical history, past social history, past surgical history and problem list.  Review of Systems Pertinent items are noted in HPI.   Objective:    BP 105/59 mmHg  Pulse 74  Ht 5\' 5"  (1.651 m)  Wt 199 lb (90.266 kg)  BMI 33.12 kg/m2  Breastfeeding? Yes  General Appearance:    Alert, cooperative, no distress, appears stated age  Head:    Normocephalic, without obvious abnormality, atraumatic  Eyes:    conjunctiva/corneas clear, EOM's intact, both eyes  Ears:    Normal external ear canals, both ears  Nose:   Nares normal, septum midline, mucosa normal, no drainage    or sinus tenderness  Throat:   Lips, mucosa, and tongue normal; teeth and gums normal  Neck:   Supple, symmetrical, trachea midline, no adenopathy;    thyroid:  no enlargement/tenderness/nodules  Back:     Symmetric, no curvature, ROM normal, no CVA tenderness  Lungs:     Clear to auscultation bilaterally, respirations unlabored  Chest Wall:    No tenderness or deformity   Heart:    Regular rate and rhythm, S1 and S2 normal, no murmur, rub   or gallop  Breast Exam:    Not done  Abdomen:     Soft, non-tender, bowel sounds active all four  quadrants,    no masses, no organomegaly  Genitalia:    Normal female without lesion, discharge or tenderness     Extremities:   Extremities normal, atraumatic, no cyanosis or edema  Pulses:   2+ and symmetric all extremities  Skin:   Skin color, texture, turgor normal, no rashes or lesions     Assessment:    7 weeks postpartum exam. Pap smear done at today's visit.   Contraception counseling- declined.  Will use rhythmmethod   Plan:    1. Contraception: rhythm method 2.  Follow up in: 1 year or as needed.

## 2016-02-11 NOTE — Patient Instructions (Signed)

## 2016-02-15 LAB — CYTOLOGY - PAP

## 2016-05-21 IMAGING — CR DG KNEE COMPLETE 4+V*L*
4 series · 4 of 4 positions shown · non-contrast
Comparison: None

CLINICAL DATA: Fell onto both knees today, LEFT knee pain more
severe than RIGHT, anterior pain, pain with weight-bearing and
bending knee, initial encounter

EXAM:
LEFT KNEE - COMPLETE 4+ VIEW

[knee ap]
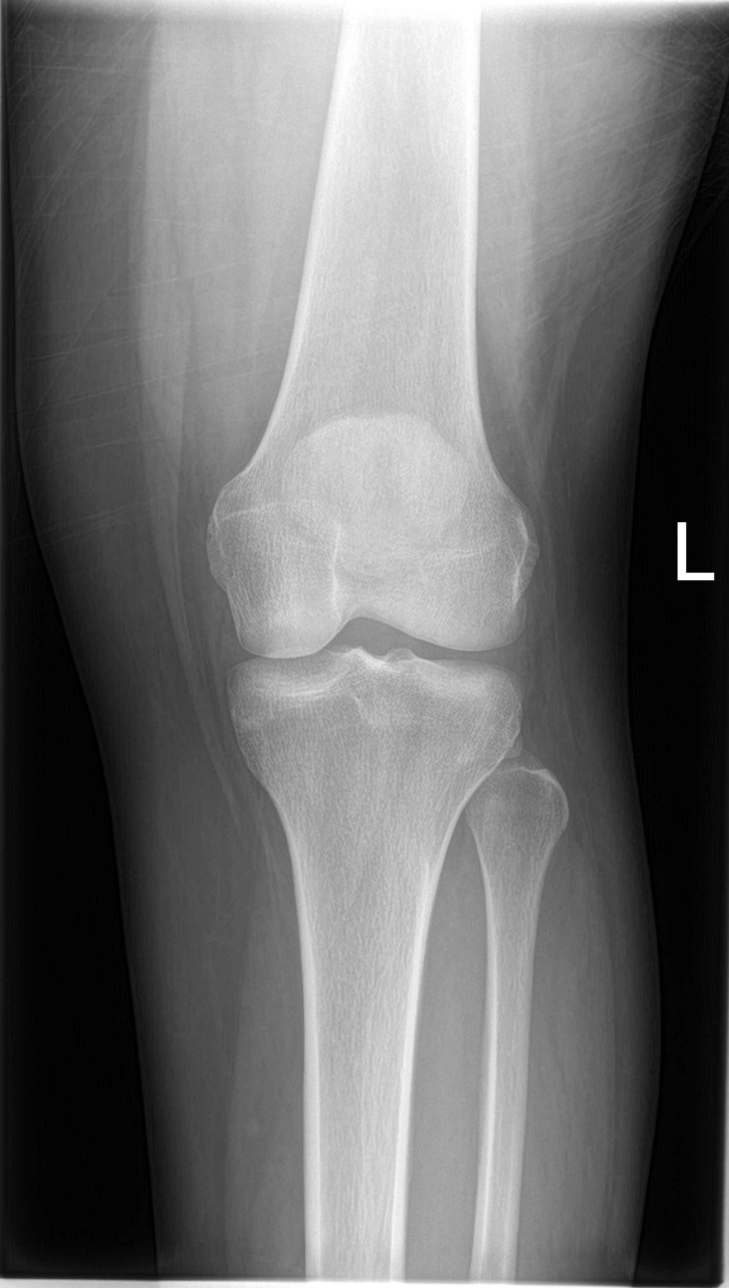

[knee lat]
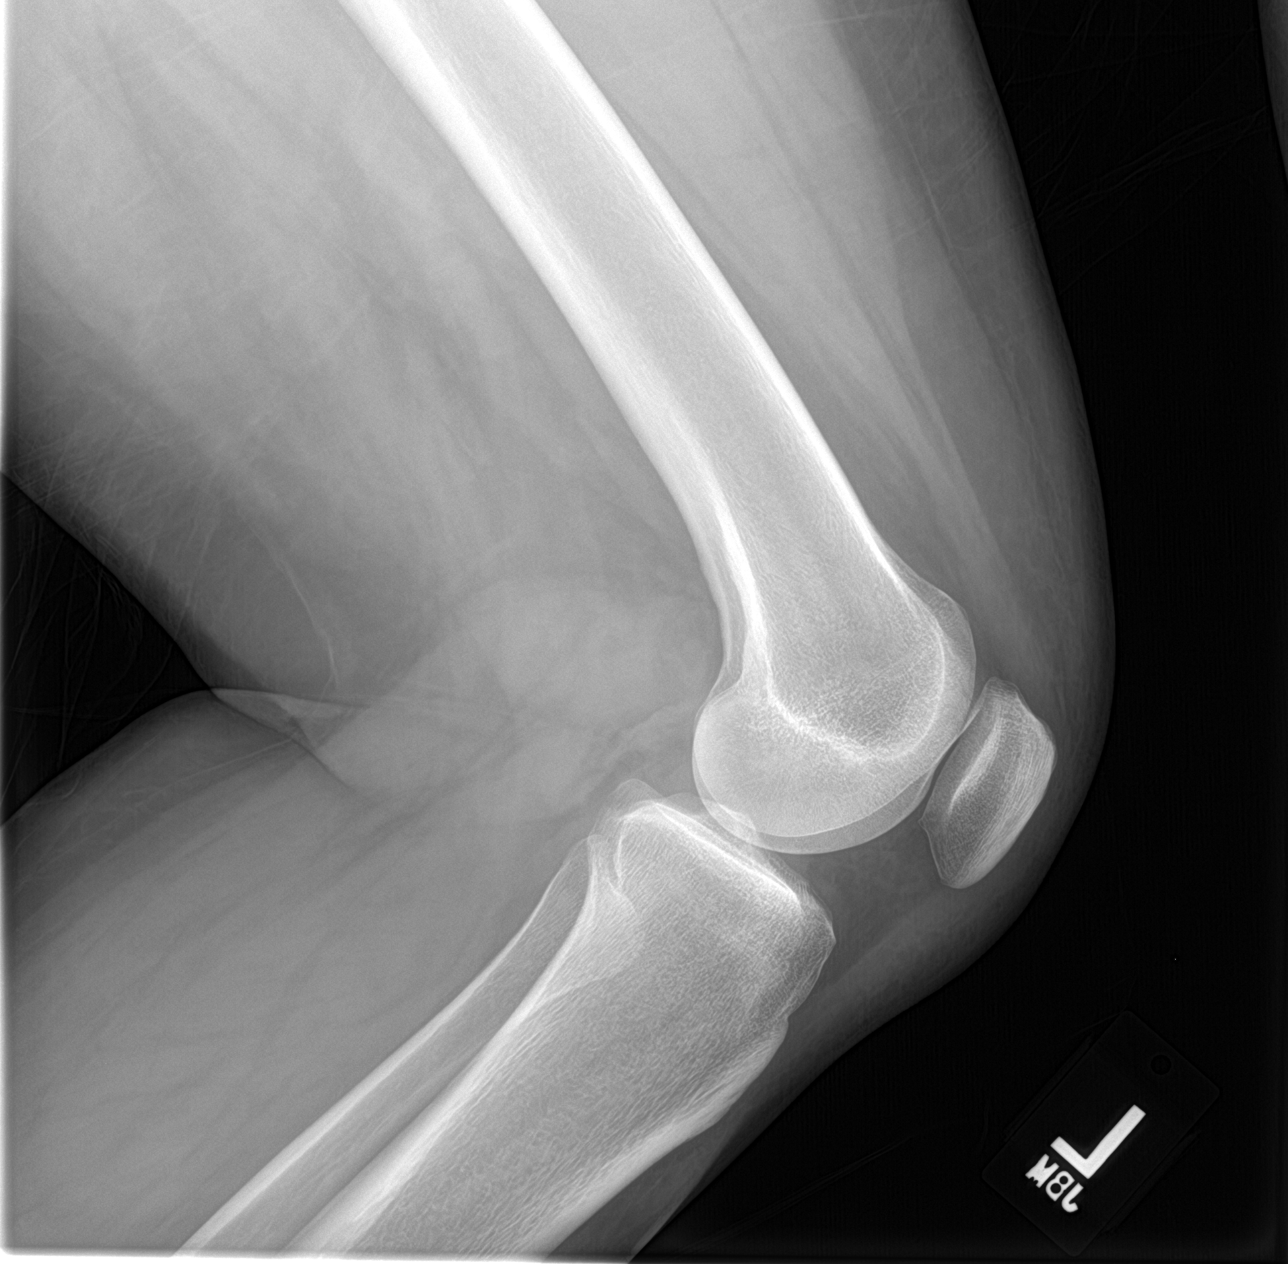

[knee obl (1 of 2)]
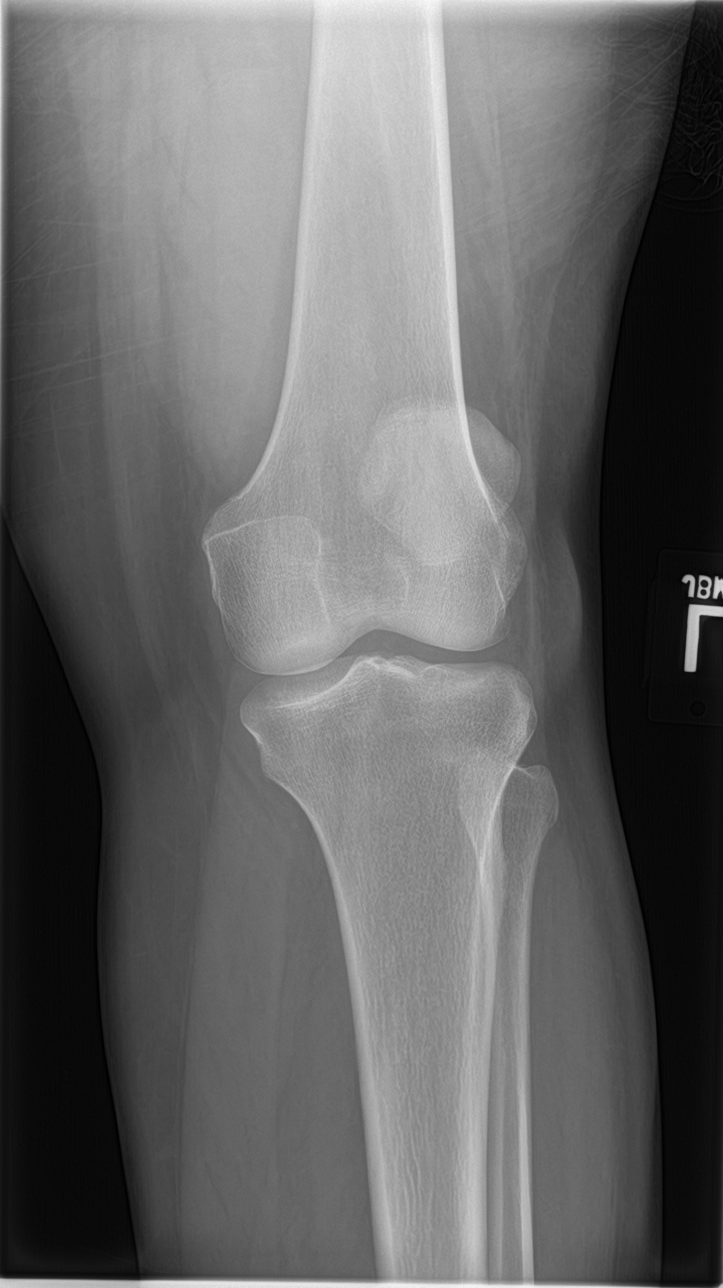

[knee obl (2 of 2)]
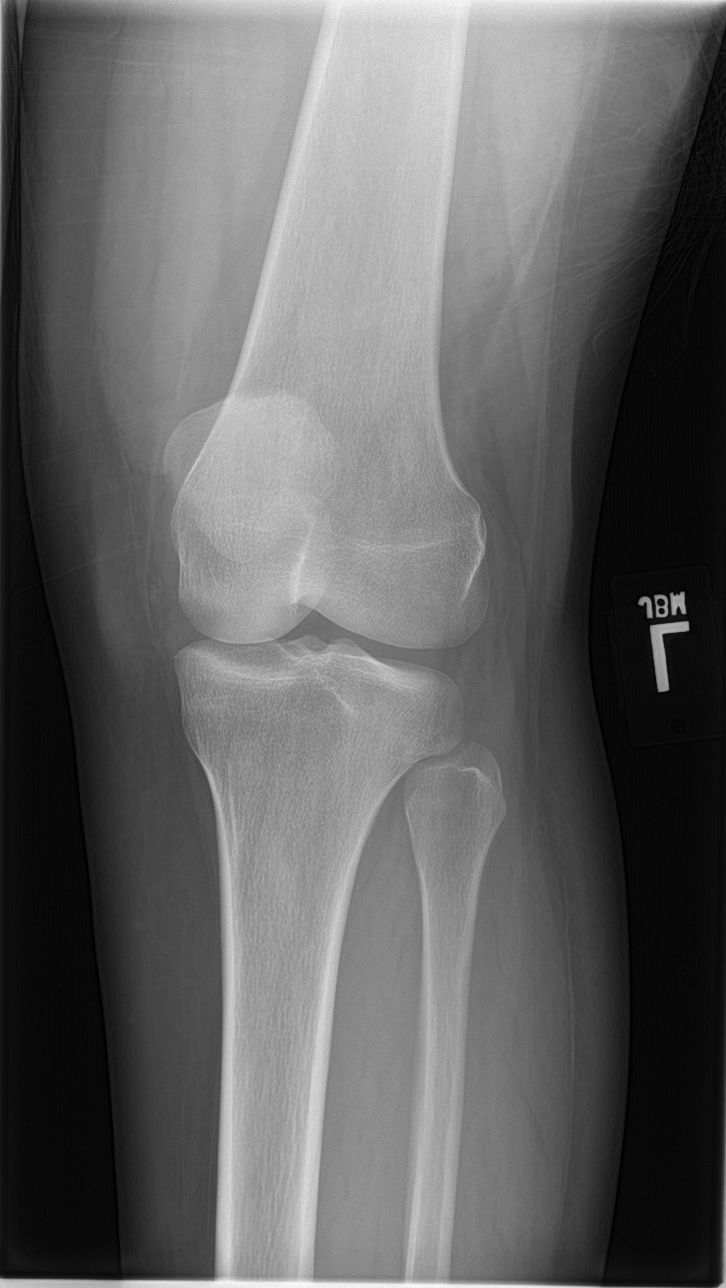

[4 of 4 positions shown; findings below may reference images not displayed]

FINDINGS: Bone mineralization normal.

Joint spaces preserved.

No fracture, dislocation, or bone destruction.

No joint effusion.

Anterior soft tissue swelling noted.
IMPRESSION: No acute bony abnormalities.
